# Patient Record
Sex: Male | Born: 1995
Health system: Southern US, Community
[De-identification: ages and names within clinical notes are randomized; demographics above are authoritative.]

## PROBLEM LIST (undated history)

## (undated) DIAGNOSIS — F909 Attention-deficit hyperactivity disorder, unspecified type: Secondary | ICD-10-CM

## (undated) DIAGNOSIS — J45909 Unspecified asthma, uncomplicated: Secondary | ICD-10-CM

## (undated) HISTORY — DX: Attention-deficit hyperactivity disorder, unspecified type: F90.9

## (undated) HISTORY — PX: OTHER SURGICAL HISTORY: SHX169

## (undated) HISTORY — DX: Unspecified asthma, uncomplicated: J45.909

## (undated) HISTORY — PX: WISDOM TOOTH EXTRACTION: SHX21

---
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
## 2015-10-27 ENCOUNTER — Encounter: Payer: Self-pay | Admitting: *Deleted

## 2015-11-03 ENCOUNTER — Ambulatory Visit (INDEPENDENT_AMBULATORY_CARE_PROVIDER_SITE_OTHER): Payer: Managed Care, Other (non HMO) | Admitting: General Surgery

## 2015-11-03 ENCOUNTER — Encounter: Payer: Self-pay | Admitting: General Surgery

## 2015-11-03 VITALS — BP 128/68 | HR 68 | Resp 12 | Ht 69.0 in | Wt 180.0 lb

## 2015-11-03 DIAGNOSIS — L0501 Pilonidal cyst with abscess: Secondary | ICD-10-CM | POA: Diagnosis not present

## 2015-11-03 MED ORDER — SULFAMETHOXAZOLE-TRIMETHOPRIM 800-160 MG PO TABS: 1.0000 | ORAL_TABLET | Freq: Two times a day (BID) | ORAL | Status: DC

## 2015-11-03 NOTE — Progress Notes (Signed)
Patient ID: Nicholas Villanueva, male   DOB: Mar 12, 1996, 20 y.o.   MRN: [id number]  Chief Complaint  Patient presents with  . Other    pilonidal cyst    HPI Nicholas Villanueva is a 20 y.o. male here for evaluation of a possible pilonidal cyst. He is currently on antibiotic therapy for this. He states it started about one month ago with drainage and tenderness. Denies an injury or trauma. 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 He is here today with his mother, Nicholas Villanueva. He completed his first 2 years of college at [employer]. He did score a 5 on the AP calculus exam. I personally reviewed the patient's history.  HPI  Past Medical History  Diagnosis Date  . Asthma   . ADHD (attention deficit hyperactivity disorder)     Past Surgical History  Procedure Laterality Date  . Skin biopsies  8 years ago    neck    Family History  Problem Relation Age of Onset  . Breast cancer Mother     Social History Social History  Substance Use Topics  . Smoking status: Never Smoker   . Smokeless tobacco: Never Used  . Alcohol Use: No    No Known Allergies  Current Outpatient Prescriptions  Medication Sig Dispense Refill  . doxycycline (VIBRAMYCIN) 100 MG capsule Take 1 capsule by mouth 2 (two) times daily.  0  . methylphenidate 27 MG PO CR tablet Take 27 mg by mouth every morning.  0  . sulfamethoxazole-trimethoprim (BACTRIM DS) 800-160 MG tablet Take 1 tablet by mouth 2 (two) times daily. 20 tablet 0   No current facility-administered medications for this visit.    Review of Systems Review of Systems  Constitutional: Negative.   Respiratory: Negative.   Cardiovascular: Negative.     Blood pressure 128/68, pulse 68, resp. rate 12, height 5\' 9"  (1.753 m), weight 180 lb (81.647 kg).  Physical Exam Physical Exam  Constitutional: He is oriented to person, place, and time. He appears well-developed and well-nourished.  HENT:  Mouth/Throat:  Oropharynx is clear and moist.  Eyes: Conjunctivae are normal. No scleral icterus.  Neck: Neck supple.  Cardiovascular: Normal rate, regular rhythm and normal heart sounds.   Pulmonary/Chest: Effort normal and breath sounds normal.  Musculoskeletal:       Back:  Lymphadenopathy:    He has no cervical adenopathy.  Neurological: He is alert and oriented to person, place, and time.  Skin: Skin is warm and dry.  Pilonidal cyst  Psychiatric: His behavior is normal.    Data Reviewed [facility] notes of 10/27/2015 and 10/10/2015 reviewed. Biopsy result of this area by description benign epidermal hyperplasia with granulation tissue. The patient was placed on doxycycline.    Assessment    Pilonidal cyst with abscess.    Plan    This young man [place]. He is presently minimally symptomatic and would like to defer intervention until afterwards. He'll complete the presently prescribed doxycycline. Avoidance of sun exposure was reviewed. A prescription for Bactrim DS has been sent to his pharmacy to have on hand if he experiences a flare erring his travels. We'll look to planned for excision of this area in early July.  This should allow time for adequate healing prior to starting school at [employer] in mid-August.     Schedule surgery  Bactrim DS #20 to have on hand  while on vacation.  Patient's surgery has been scheduled for 12-12-15 at [facility].    PCP: None Ref: Dr Pilar Plate   Earline Mayotte 11/03/2015, 6:07 PM

## 2015-11-03 NOTE — Patient Instructions (Addendum)
Pilonidal Cyst A pilonidal cyst is a fluid-filled sac. It forms beneath the skin near your tailbone, at the top of the crease of your buttocks. A pilonidal cyst that is not large or infected may not cause symptoms or problems. If the cyst becomes irritated or infected, it may fill with pus. This causes pain and swelling (pilonidal abscess). An infected cyst may need to be treated with medicine, drained, or removed. CAUSES The cause of a pilonidal cyst is not known. One cause may be a hair that grows into your skin (ingrown hair). RISK FACTORS Pilonidal cysts are more common in boys and men. Risk factors include:  Having lots of hair near the crease of the buttocks.  Being overweight.  Having a pilonidal dimple.  Wearing tight clothing.  Not bathing or showering frequently.  Sitting for long periods of time. SIGNS AND SYMPTOMS Signs and symptoms of a pilonidal cyst may include:  Redness.  Pain and tenderness.  Warmth.  Swelling.  Pus.  Fever. DIAGNOSIS Your health care provider may diagnose a pilonidal cyst based on your symptoms and a physical exam. The health care provider may do a blood test to check for infection. If your cyst is draining pus, your health care provider may take a sample of the drainage to be tested at a laboratory. TREATMENT Surgery is the usual treatment for an infected pilonidal cyst. You may also have to take medicines before surgery. The type of surgery you have depends on the size and severity of the infected cyst. The different kinds of surgery include:  Incision and drainage. This is a procedure to open and drain the cyst.  Marsupialization. In this procedure, a large cyst or abscess may be opened and kept open by stitching the edges of the skin to the cyst walls.  Cyst removal. This procedure involves opening the skin and removing all or part of the cyst. HOME CARE INSTRUCTIONS  Follow all of your surgeon's instructions carefully if you had  surgery.  Take medicines only as directed by your health care provider.  If you were prescribed an antibiotic medicine, finish it all even if you start to feel better.  Keep the area around your pilonidal cyst clean and dry.  Clean the area as directed by your health care provider. Pat the area dry with a clean towel. Do not rub it as this may cause bleeding.  Remove hair from the area around the cyst as directed by your health care provider.  Do not wear tight clothing or sit in one place for long periods of time.  There are many different ways to close and cover an incision, including stitches, skin glue, and adhesive strips. Follow your health care provider's instructions on:  Incision care.  Bandage (dressing) changes and removal.  Incision closure removal. SEEK MEDICAL CARE IF:   You have drainage, redness, swelling, or pain at the site of the cyst.  You have a fever.   This information is not intended to replace advice given to you by your health care provider. Make sure you discuss any questions you have with your health care provider.   Document Released: 05/18/2000 Document Revised: 06/11/2014 Document Reviewed: 10/08/2013 Elsevier Interactive Patient Education 2016 [employer]  Patient[facility].

## 2015-11-14 ENCOUNTER — Encounter: Payer: Self-pay | Admitting: General Surgery

## 2015-11-14 ENCOUNTER — Ambulatory Visit (INDEPENDENT_AMBULATORY_CARE_PROVIDER_SITE_OTHER): Payer: Managed Care, Other (non HMO) | Admitting: General Surgery

## 2015-11-14 ENCOUNTER — Other Ambulatory Visit: Payer: Self-pay | Admitting: General Surgery

## 2015-11-14 VITALS — BP 126/78 | HR 68 | Ht 69.0 in | Wt 180.0 lb

## 2015-11-14 DIAGNOSIS — L0501 Pilonidal cyst with abscess: Secondary | ICD-10-CM

## 2015-11-14 NOTE — Progress Notes (Signed)
Patient ID: Nicholas Villanueva, male   DOB: [birth year], 20 y.o.   MRN: [id number]  Chief Complaint  Patient presents with  . Other    pilonidal cyst    HPI Nicholas HumblesCameron G Villanueva is a 20 y.o. male here for reassessment of pilonidal cyst. He is scheduled for excision on 12/12/15. He reports that the area has been draining Since his original evaluation, and after completing the doxycycline previously prescribed he began the Bactrim provided if he had a flare prior to his scheduled surgery. He reports the area has enlarged and he started his prescription for Bactrim on 11/11/15.  I personally reviewed the patient's history.  The patient is accompanied today by his mother, Rivka BarbaraGlenda. HPI  Past Medical History  Diagnosis Date  . Asthma   . ADHD (attention deficit hyperactivity disorder)     Past Surgical History  Procedure Laterality Date  . Skin biopsies  8 years ago    neck    Family History  Problem Relation Age of Onset  . Breast cancer Mother     Social History Social History  Substance Use Topics  . Smoking status: Never Smoker   . Smokeless tobacco: Never Used  . Alcohol Use: No    Allergies  Allergen Reactions  . Other Anaphylaxis    Peanuts    Current Outpatient Prescriptions  Medication Sig Dispense Refill  . doxycycline (VIBRAMYCIN) 100 MG capsule Take 1 capsule by mouth 2 (two) times daily.  0  . methylphenidate 27 MG PO CR tablet Take 27 mg by mouth every morning.  0  . sulfamethoxazole-trimethoprim (BACTRIM DS) 800-160 MG tablet Take 1 tablet by mouth 2 (two) times daily. 20 tablet 0   No current facility-administered medications for this visit.    Review of Systems Review of Systems  Constitutional: Negative.   Respiratory: Negative.   Cardiovascular: Negative.     Blood pressure 126/78, pulse 68, height 5\' 9"  (1.753 m), weight 180 lb (81.647 kg).  Physical Exam Physical Exam  Musculoskeletal:       Back:      Assessment    Chronic abscess related to  pilonidal cyst.    Plan    It was elected to completed an incision and drainage of the draining site. This was completed using 10 mL of 0.5% Xylocaine with 0.25% Marcaine with 1-200,000 epinephrine after cleansing the skin with Betadine. The nodular area was admitted through a short elliptical incision. Thick mucoid material consistent with pilonidal cyst fluid and a few hairs were identified. This tracked towards the central "pits". Dry dressing applied. Wound care reviewed with the mother.  The patient will take his doses of Bactrim today, but discontinue after these doses.  We'll plan for reexamination in one week and his formal surgical excision as originally scheduled.    PCP Minter This has been scribed by Sinda Duaryl-Lyn M Kennedy LPN    Earline MayotteByrnett, Samanvi Cuccia W 11/14/2015, 8:43 PM

## 2015-11-14 NOTE — Patient Instructions (Signed)
You may shower. You will have some drainage this is normal. Take one dose of Bactrim  tonight, then stop. Return in 1 week.

## 2015-11-22 ENCOUNTER — Encounter: Payer: Self-pay | Admitting: General Surgery

## 2015-11-22 ENCOUNTER — Ambulatory Visit (INDEPENDENT_AMBULATORY_CARE_PROVIDER_SITE_OTHER): Payer: Managed Care, Other (non HMO) | Admitting: General Surgery

## 2015-11-22 VITALS — BP 124/80 | HR 78 | Resp 12 | Ht 69.0 in | Wt 180.0 lb

## 2015-11-22 DIAGNOSIS — L0501 Pilonidal cyst with abscess: Secondary | ICD-10-CM

## 2015-11-22 NOTE — Patient Instructions (Signed)
Patient is scheduled for his excision on 12/12/15.

## 2015-11-22 NOTE — Progress Notes (Signed)
Patient ID: Nicholas Villanueva, male   DOB: 1996/03/25, 20 y.o.   MRN: [id number]  Chief Complaint  Patient presents with  . Follow-up    pilonidal cyst     HPI Nicholas Villanueva is a 20 y.o. male here today for his one week follow up pilonidal cyst. He is scheduled for excision on 12/12/15. He reports that the area is not draining anymore.    He denies any pain.  I reviewed the history. HPI  Past Medical History  Diagnosis Date  . Asthma   . ADHD (attention deficit hyperactivity disorder)     Past Surgical History  Procedure Laterality Date  . Skin biopsies  8 years ago    neck    Family History  Problem Relation Age of Onset  . Breast cancer Mother     Social History Social History  Substance Use Topics  . Smoking status: Never Smoker   . Smokeless tobacco: Never Used  . Alcohol Use: No    Allergies  Allergen Reactions  . Other Anaphylaxis    Peanuts    Current Outpatient Prescriptions  Medication Sig Dispense Refill  . methylphenidate 27 MG PO CR tablet Take 27 mg by mouth every morning.  0   No current facility-administered medications for this visit.    Review of Systems Review of Systems  Constitutional: Negative.   Respiratory: Negative.   Cardiovascular: Negative.     Blood pressure 124/80, pulse 78, resp. rate 12, height 5\' 9"  (1.753 m), weight 180 lb (81.647 kg).  Physical Exam Physical Exam  Constitutional: He is oriented to person, place, and time. He appears well-developed and well-nourished.  Musculoskeletal:       Back:  Neurological: He is alert and oriented to person, place, and time.  Skin: Skin is warm.    Data Reviewed Pathology showed chronic inflammatory tissue consistent with a pilonidal cyst.  Assessment    Doing well status post excision of main abscess cavity.    Plan    [place] week of July and we'll plan for formal excision of the pilonidal cyst on his  return.   Return for excision that is scheduled for 12/12/15. PCP:  Chelsea PrimusMinter This information has been scribed by Ples SpecterJessica Qualls CMA.    Earline MayotteByrnett, Wasif Simonich W 11/22/2015, 2:49 PM

## 2015-12-05 ENCOUNTER — Inpatient Hospital Stay: Admission: RE | Admit: 2015-12-05 | Payer: Self-pay | Source: Ambulatory Visit

## 2015-12-05 ENCOUNTER — Encounter: Payer: Self-pay | Admitting: *Deleted

## 2015-12-05 NOTE — Patient Instructions (Signed)
  Your procedure is scheduled on: 12/12/15 Report to Day Surgery.[facility]OND FLOOR To find out your arrival time please call [phone] between 1PM - 3PM on 12/09/15  Remember: Instructions that are not followed completely may result in serious medical risk, up to and including death, or upon the discretion of your surgeon and anesthesiologist your surgery may need to be rescheduled.    _X___ 1. Do not eat food or drink liquids after midnight. No gum chewing or hard candies.     __X__ 2. No Alcohol for 24 hours before or after surgery.   __X__ 3. Do Not Smoke For 24 Hours Prior to Your Surgery.   ____ 4. Bring all medications with you on the day of surgery if instructed.    _X___ 5. Notify your doctor if there is any change in your medical condition     (cold, fever, infections).       Do not wear jewelry, make-up, hairpins, clips or nail polish.  Do not wear lotions, powders, or perfumes. You may wear deodorant.  Do not shave 48 hours prior to surgery. Men may shave face and neck.  Do not bring valuables to the hospital.    [facility] is not responsible for any belongings or valuables.               Contacts, dentures or bridgework may not be worn into surgery.  Leave your suitcase in the car. After surgery it may be brought to your room.  For patients admitted to the hospital, discharge time is determined by your                treatment team.   Patients discharged the day of surgery will not be allowed to drive home.   Please read over the following fact sheets that you were given:   Surgical Site Infection Prevention   ____ Take these medicines the morning of surgery with A SIP OF WATER:    1. NONE  2.   3.   4.  5.  6.  ____ Fleet Enema (as directed)   ____ Use CHG Soap as directed  ____ Use inhalers on the day of surgery  ____ Stop metformin 2 days prior to surgery    ____ Take 1/2 of usual insulin dose the night before surgery and none on the morning  of surgery.   ____ Stop Coumadin/Plavix/aspirin on   ____ Stop Anti-inflammatories on   ____ Stop supplements until after surgery.    ____ Bring C-Pap to the hospital.  

## 2015-12-05 NOTE — Pre-Procedure Instructions (Signed)
OUT OF TOWN UNTIL 12/11/15. UNABLE TO PICK UP CHG SOAP

## 2015-12-12 ENCOUNTER — Ambulatory Visit
Admission: RE | Admit: 2015-12-12 | Discharge: 2015-12-12 | Disposition: A | Discharge status: Home / Self Care | Payer: Managed Care, Other (non HMO) | Source: Ambulatory Visit | Attending: General Surgery | Admitting: General Surgery

## 2015-12-12 ENCOUNTER — Ambulatory Visit: Payer: Managed Care, Other (non HMO) | Admitting: Anesthesiology

## 2015-12-12 ENCOUNTER — Encounter
Admission: RE | Disposition: A | Discharge status: Home / Self Care | Payer: Self-pay | Source: Ambulatory Visit | Attending: General Surgery

## 2015-12-12 ENCOUNTER — Encounter: Payer: Self-pay | Admitting: *Deleted

## 2015-12-12 DIAGNOSIS — L0501 Pilonidal cyst with abscess: Secondary | ICD-10-CM | POA: Diagnosis not present

## 2015-12-12 HISTORY — PX: PILONIDAL CYST EXCISION: SHX744

## 2015-12-12 SURGERY — EXCISION, PILONIDAL CYST, EXTENSIVE
Anesthesia: General | Wound class: Dirty or Infected

## 2015-12-12 MED ORDER — SUCCINYLCHOLINE CHLORIDE 20 MG/ML IJ SOLN
INTRAMUSCULAR | Status: DC | PRN
  Administered 2015-12-12: 100 mg via INTRAVENOUS

## 2015-12-12 MED ORDER — BUPIVACAINE-EPINEPHRINE (PF) 0.5% -1:200000 IJ SOLN
INTRAMUSCULAR | Status: AC
  Filled 2015-12-12: qty 30

## 2015-12-12 MED ORDER — FENTANYL CITRATE (PF) 100 MCG/2ML IJ SOLN
25.0000 ug | INTRAMUSCULAR | Status: DC | PRN
  Administered 2015-12-12: 25 ug via INTRAVENOUS

## 2015-12-12 MED ORDER — ONDANSETRON HCL 4 MG/2ML IJ SOLN
4.0000 mg | Freq: Once | INTRAMUSCULAR | Status: AC | PRN
  Administered 2015-12-12: 4 mg via INTRAVENOUS

## 2015-12-12 MED ORDER — SODIUM CHLORIDE 0.9 % IV SOLN
1.0000 g | INTRAVENOUS | Status: DC
  Filled 2015-12-12: qty 1

## 2015-12-12 MED ORDER — GLYCOPYRROLATE 0.2 MG/ML IJ SOLN
INTRAMUSCULAR | Status: DC | PRN
  Administered 2015-12-12: 0.6 mg via INTRAVENOUS

## 2015-12-12 MED ORDER — SODIUM CHLORIDE 0.9 % IV SOLN
1.0000 g | INTRAVENOUS | Status: AC
  Administered 2015-12-12: 1 g via INTRAVENOUS
  Filled 2015-12-12: qty 1

## 2015-12-12 MED ORDER — NEOSTIGMINE METHYLSULFATE 10 MG/10ML IV SOLN
INTRAVENOUS | Status: DC | PRN
  Administered 2015-12-12: 3 mg via INTRAVENOUS

## 2015-12-12 MED ORDER — MIDAZOLAM HCL 2 MG/2ML IJ SOLN
INTRAMUSCULAR | Status: DC | PRN
  Administered 2015-12-12: 2 mg via INTRAVENOUS

## 2015-12-12 MED ORDER — BUPIVACAINE HCL (PF) 0.5 % IJ SOLN
INTRAMUSCULAR | Status: AC
  Filled 2015-12-12: qty 30

## 2015-12-12 MED ORDER — PROPOFOL 10 MG/ML IV BOLUS
INTRAVENOUS | Status: DC | PRN
  Administered 2015-12-12: 160 mg via INTRAVENOUS

## 2015-12-12 MED ORDER — LIDOCAINE-EPINEPHRINE 1 %-1:100000 IJ SOLN
INTRAMUSCULAR | Status: AC
  Filled 2015-12-12: qty 1

## 2015-12-12 MED ORDER — SODIUM BICARBONATE 4 % IV SOLN
INTRAVENOUS | Status: AC
  Filled 2015-12-12: qty 5

## 2015-12-12 MED ORDER — BUPIVACAINE-EPINEPHRINE (PF) 0.5% -1:200000 IJ SOLN
INTRAMUSCULAR | Status: DC | PRN
  Administered 2015-12-12: 30 mL via PERINEURAL

## 2015-12-12 MED ORDER — HYDROCODONE-ACETAMINOPHEN 5-325 MG PO TABS: 1.0000 | ORAL_TABLET | ORAL | Status: DC | PRN

## 2015-12-12 MED ORDER — ACETAMINOPHEN 10 MG/ML IV SOLN
INTRAVENOUS | Status: AC
  Filled 2015-12-12: qty 100

## 2015-12-12 MED ORDER — FAMOTIDINE 20 MG PO TABS
20.0000 mg | ORAL_TABLET | Freq: Once | ORAL | Status: AC
  Administered 2015-12-12: 20 mg via ORAL

## 2015-12-12 MED ORDER — LACTATED RINGERS IV SOLN
INTRAVENOUS | Status: DC
  Administered 2015-12-12: 07:00:00 via INTRAVENOUS

## 2015-12-12 MED ORDER — FENTANYL CITRATE (PF) 100 MCG/2ML IJ SOLN
INTRAMUSCULAR | Status: DC | PRN
  Administered 2015-12-12 (×2): 50 ug via INTRAVENOUS
  Administered 2015-12-12: 25 ug via INTRAVENOUS

## 2015-12-12 MED ORDER — KETOROLAC TROMETHAMINE 30 MG/ML IJ SOLN
INTRAMUSCULAR | Status: DC | PRN
  Administered 2015-12-12: 30 mg via INTRAVENOUS

## 2015-12-12 MED ORDER — ROCURONIUM BROMIDE 100 MG/10ML IV SOLN
INTRAVENOUS | Status: DC | PRN
  Administered 2015-12-12 (×2): 10 mg via INTRAVENOUS
  Administered 2015-12-12: 5 mg via INTRAVENOUS

## 2015-12-12 MED ORDER — LIDOCAINE HCL (CARDIAC) 20 MG/ML IV SOLN
INTRAVENOUS | Status: DC | PRN
  Administered 2015-12-12: 30 mg via INTRAVENOUS

## 2015-12-12 MED ORDER — FAMOTIDINE 20 MG PO TABS
ORAL_TABLET | ORAL | Status: AC
  Filled 2015-12-12: qty 1

## 2015-12-12 MED ORDER — ONDANSETRON HCL 4 MG/2ML IJ SOLN
INTRAMUSCULAR | Status: AC
  Filled 2015-12-12: qty 2

## 2015-12-12 MED ORDER — FENTANYL CITRATE (PF) 100 MCG/2ML IJ SOLN
INTRAMUSCULAR | Status: AC
  Filled 2015-12-12: qty 2

## 2015-12-12 MED ORDER — ONDANSETRON HCL 4 MG/2ML IJ SOLN
INTRAMUSCULAR | Status: DC | PRN
  Administered 2015-12-12: 4 mg via INTRAVENOUS

## 2015-12-12 MED ORDER — ACETAMINOPHEN 10 MG/ML IV SOLN
INTRAVENOUS | Status: DC | PRN
  Administered 2015-12-12: 1000 mg via INTRAVENOUS

## 2015-12-12 SURGICAL SUPPLY — 38 items
BLADE CLIPPER SURG (BLADE) ×3 IMPLANT
BLADE SURG 11 STRL SS SAFETY (MISCELLANEOUS) ×3 IMPLANT
BLADE SURG 15 STRL SS SAFETY (BLADE) ×3 IMPLANT
BULB RESERV EVAC DRAIN JP 100C (MISCELLANEOUS) ×3 IMPLANT
CANISTER SUCT 1200ML W/VALVE (MISCELLANEOUS) ×3 IMPLANT
CLOSURE WOUND 1/2 X4 (GAUZE/BANDAGES/DRESSINGS) ×1
DRAIN CHANNEL JP 15F RND 16 (MISCELLANEOUS) ×3 IMPLANT
DRAPE LAPAROTOMY 100X77 ABD (DRAPES) ×3 IMPLANT
DRSG TEGADERM 4X4.75 (GAUZE/BANDAGES/DRESSINGS) ×6 IMPLANT
DRSG TELFA 3X8 NADH (GAUZE/BANDAGES/DRESSINGS) ×6 IMPLANT
ELECT CAUTERY BLADE 6.4 (BLADE) ×3 IMPLANT
ELECT REM PT RETURN 9FT ADLT (ELECTROSURGICAL) ×3
ELECTRODE REM PT RTRN 9FT ADLT (ELECTROSURGICAL) ×1 IMPLANT
GLOVE BIO SURGEON STRL SZ 6.5 (GLOVE) ×2 IMPLANT
GLOVE BIO SURGEON STRL SZ7.5 (GLOVE) ×3 IMPLANT
GLOVE BIO SURGEONS STRL SZ 6.5 (GLOVE) ×1
GLOVE INDICATOR 8.0 STRL GRN (GLOVE) ×3 IMPLANT
GOWN STRL REUS W/ TWL LRG LVL3 (GOWN DISPOSABLE) ×2 IMPLANT
GOWN STRL REUS W/TWL LRG LVL3 (GOWN DISPOSABLE) ×4
KIT RM TURNOVER STRD PROC AR (KITS) ×3 IMPLANT
LABEL OR SOLS (LABEL) IMPLANT
NDL SAFETY 22GX1.5 (NEEDLE) ×3 IMPLANT
NEEDLE HYPO 25X1 1.5 SAFETY (NEEDLE) ×3 IMPLANT
NS IRRIG 500ML POUR BTL (IV SOLUTION) ×3 IMPLANT
PACK BASIN MINOR ARMC (MISCELLANEOUS) ×3 IMPLANT
SOL PREP PVP 2OZ (MISCELLANEOUS) ×3
SOLUTION PREP PVP 2OZ (MISCELLANEOUS) ×1 IMPLANT
STRIP CLOSURE SKIN 1/2X4 (GAUZE/BANDAGES/DRESSINGS) ×2 IMPLANT
SUT ETHILON 3-0 FS-10 30 BLK (SUTURE) ×3
SUT PROLENE 4-0 (SUTURE) ×2
SUT PROLENE 4-0 RB1 30XMFL BLU (SUTURE) ×1
SUT VIC AB 2-0 CT2 27 (SUTURE) ×6 IMPLANT
SUT VICRYL 3-0 27IN (SUTURE) IMPLANT
SUT VICRYL+ 3-0 144IN (SUTURE) ×3 IMPLANT
SUTURE EHLN 3-0 FS-10 30 BLK (SUTURE) ×1 IMPLANT
SUTURE PROLEN 4-0 RB1 30XMFL (SUTURE) ×1 IMPLANT
SWABSTK COMLB BENZOIN TINCTURE (MISCELLANEOUS) ×9 IMPLANT
SYR CONTROL 10ML (SYRINGE) ×3 IMPLANT

## 2015-12-12 NOTE — Anesthesia Preprocedure Evaluation (Signed)
Anesthesia Evaluation  Patient identified by MRN, date of birth, ID band Patient awake    Reviewed: Allergy & Precautions, NPO status , Patient's Chart, lab work & pertinent test results, reviewed documented beta blocker date and time   Airway Mallampati: II  TM Distance: >3 FB     Dental  (+) Chipped   Pulmonary           Cardiovascular      Neuro/Psych    GI/Hepatic   Endo/Other    Renal/GU      Musculoskeletal   Abdominal   Peds  Hematology   Anesthesia Other Findings   Reproductive/Obstetrics                             Anesthesia Physical Anesthesia Plan  ASA: II  Anesthesia Plan: General   Post-op Pain Management:    Induction: Intravenous  Airway Management Planned: LMA and Oral ETT  Additional Equipment:   Intra-op Plan:   Post-operative Plan:   Informed Consent: I have reviewed the patients History and Physical, chart, labs and discussed the procedure including the risks, benefits and alternatives for the proposed anesthesia with the patient or authorized representative who has indicated his/her understanding and acceptance.     Plan Discussed with: CRNA  Anesthesia Plan Comments:         Anesthesia Quick Evaluation

## 2015-12-12 NOTE — Transfer of Care (Signed)
Immediate Anesthesia Transfer of Care Note  Patient: Nicholas HumblesCameron G Hechavarria  Procedure(s) Performed: Procedure(s): CYST EXCISION PILONIDAL EXTENSIVE (N/A)  Patient Location: PACU  Anesthesia Type:General  Level of Consciousness: awake and sedated  Airway & Oxygen Therapy: Patient Spontanous Breathing and Patient connected to face mask oxygen  Post-op Assessment: Report given to RN and Post -op Vital signs reviewed and stable  Post vital signs: Reviewed and stable  Last Vitals:  Filed Vitals:   12/12/15 0601  BP: 123/71  Pulse: 70  Temp: 36.6 C  Resp: 16    Last Pain: There were no vitals filed for this visit.       Complications: No apparent anesthesia complications

## 2015-12-12 NOTE — Discharge Instructions (Signed)

## 2015-12-12 NOTE — Progress Notes (Signed)
Applied ice pack to bottom

## 2015-12-12 NOTE — Anesthesia Postprocedure Evaluation (Signed)
Anesthesia Post Note  Patient: Richrd HumblesCameron G Knisley  Procedure(s) Performed: Procedure(s) (LRB): CYST EXCISION PILONIDAL EXTENSIVE (N/A)  Patient location during evaluation: Other Anesthesia Type: General Level of consciousness: awake and alert Pain management: pain level controlled Vital Signs Assessment: post-procedure vital signs reviewed and stable Respiratory status: spontaneous breathing, nonlabored ventilation, respiratory function stable and patient connected to nasal cannula oxygen Cardiovascular status: blood pressure returned to baseline and stable Postop Assessment: no signs of nausea or vomiting Anesthetic complications: no    Last Vitals:  Filed Vitals:   12/12/15 0930 12/12/15 1040  BP: 126/68 108/68  Pulse: 65 66  Temp: 36.3 C 36.3 C  Resp: 14 14    Last Pain:  Filed Vitals:   12/12/15 1102  PainSc: 0-No pain                 Nahshon Reich S

## 2015-12-12 NOTE — Anesthesia Procedure Notes (Signed)
Procedure Name: Intubation Performed by: Tonia GhentOOK-MARTIN, Semaj Kham Pre-anesthesia Checklist: Patient identified, Emergency Drugs available, Suction available, Patient being monitored and Timeout performed Patient Re-evaluated:Patient Re-evaluated prior to inductionOxygen Delivery Method: Circle system utilized and Simple face mask Preoxygenation: Pre-oxygenation with 100% oxygen Intubation Type: IV induction Ventilation: Mask ventilation without difficulty Laryngoscope Size: 3 and Miller Grade View: Grade I Tube type: Oral Nasal Tubes: Right Tube size: 7.0 mm Number of attempts: 1 Airway Equipment and Method: Stylet Placement Confirmation: ETT inserted through vocal cords under direct vision,  positive ETCO2 and CO2 detector Secured at: 22 cm Tube secured with: Tape Dental Injury: Teeth and Oropharynx as per pre-operative assessment

## 2015-12-12 NOTE — H&P (Signed)
Recurrent drainage since last visit, started on po Bactrim. For pilonidal cyst excision.

## 2015-12-12 NOTE — Op Note (Signed)
Preoperative diagnosis: Pilonidal cyst with abscess.  Postoperative diagnosis: Same.  Operative procedure: Excision of pilonidal cyst and abscess with rotation flap coverage.  Operating surgeon: Donnalee CurryJeffrey Logun Colavito, M.D.  Anesthesia: Gen. endotracheal, Marcaine 0.5% with 1-200,000 of epinephrine, 30 mL.  Estimated blood loss: Less than 20 mL.  Clinical note: This 20 year old male has developed a pilonidal cyst with abscess. He has 6 "pits" along the midline and has developed a recurrent abscess superior and to the right of these structures. He was admitted for elective repair. He received Invanz prior to the procedure. SCD stockings for DVT prevention.  Operative note: The patient underwent general anesthesia was rolled to the prone position and properly padded. The buttocks were taped apart and hair removed with clippers. The area was prepped with Betadine solution and draped. Marcaine was infiltrated peripherally for postoperative analgesia and hemostasis. The area for excision was outlined and the skin incised with a knife and the remaining dissection with electrocautery. The dissection carried down to the sacral fascia. The adipose tissue was then elevated off the gluteus fascia for approximately 2 cm and a superficial flap perhaps 6 mm in diameter made on the right side of the buttock extending for approximately 5 cm. The adipose tissue was then approximated in the midline with interrupted 2-0 Vicryl figure-of-eight sutures. A 15-gauge Blake drain was placed and brought out through a separate stab wound incision to the right of the midline and anchored in place with 3-0 nylon. The superficial layer of the adipose tissue was approximated in a similar fashion. Redundant skin approximately a centimeter in diameter was excised from the right flap. Deep dermal layer was approximated with interrupted 2-0 Vicryl sutures and the skin closed with a running 3-0 Vicryl subcuticular suture. Benzoin, Steri-Strips,  Telfa and Tegaderm dressing applied.  The patient tolerated the procedure well and was taken to the recovery room in stable condition.

## 2015-12-13 LAB — SURGICAL PATHOLOGY

## 2015-12-15 ENCOUNTER — Encounter: Payer: Self-pay | Admitting: General Surgery

## 2015-12-15 ENCOUNTER — Ambulatory Visit (INDEPENDENT_AMBULATORY_CARE_PROVIDER_SITE_OTHER): Payer: Managed Care, Other (non HMO) | Admitting: General Surgery

## 2015-12-15 VITALS — BP 116/78 | HR 80 | Resp 12 | Ht 69.0 in | Wt 179.0 lb

## 2015-12-15 DIAGNOSIS — L0501 Pilonidal cyst with abscess: Secondary | ICD-10-CM

## 2015-12-15 NOTE — Patient Instructions (Signed)
Patient to return in two weeks  

## 2015-12-15 NOTE — Progress Notes (Signed)
Patient ID: Nicholas Villanueva, male   DOB: April 02, 1996, 20 y.o.   MRN: [id number]  Chief Complaint  Patient presents with  . Routine Post Op    pilonidal cyst    HPI Nicholas Villanueva is a 20 y.o. male here today for his 3 day follow up pilonidal cyst excision done on 12/12/15. Patient states he is draining and some bleeding not much pain.  Drain sheet present. Mom, Rivka BarbaraGlenda present.  I personally reviewed the patient's history. HPI  Past Medical History  Diagnosis Date  . Asthma   . ADHD (attention deficit hyperactivity disorder)     Past Surgical History  Procedure Laterality Date  . Skin biopsies  8 years ago    neck  . Wisdom tooth extraction    . Pilonidal cyst excision N/A 12/12/2015    Procedure: CYST EXCISION PILONIDAL EXTENSIVE;  Surgeon: Earline MayotteJeffrey W Cosima Prentiss, MD;  Location: [facility] ORS;  Service: General;  Laterality: N/A;    Family History  Problem Relation Age of Onset  . Breast cancer Mother     Social History Social History  Substance Use Topics  . Smoking status: Never Smoker   . Smokeless tobacco: Never Used  . Alcohol Use: No    Allergies  Allergen Reactions  . Other Anaphylaxis    Peanuts    Current Outpatient Prescriptions  Medication Sig Dispense Refill  . methylphenidate 27 MG PO CR tablet Take 27 mg by mouth every morning.  0   No current facility-administered medications for this visit.    Review of Systems Review of Systems  Constitutional: Negative.   Respiratory: Negative.   Cardiovascular: Negative.     Blood pressure 116/78, pulse 80, resp. rate 12, height 5\' 9"  (1.753 m), weight 179 lb (81.194 kg).  Physical Exam Physical Exam  Constitutional: He is oriented to person, place, and time. He appears well-developed and well-nourished.  Eyes: Conjunctivae are normal. No scleral icterus.  Neck: Neck supple.  Cardiovascular: Normal rate, regular rhythm and normal heart sounds.   Pulmonary/Chest: Effort normal and breath sounds normal.  Right  mastectomy site is clean and well healed.  Abdominal: Normal appearance. There is no hepatomegaly.  Musculoskeletal:       Back:  Lymphadenopathy:    He has no cervical adenopathy.  Neurological: He is oriented to person, place, and time.  Skin: Skin is warm and dry.    Data Reviewed December 12, 2015 pathology: DIAGNOSIS:  A. PILONIDAL CYST; EXCISION:  - RUPTURED PILONIDAL CYST.  - NEGATIVE FOR MALIGNANCY.   Assessment    Doing well post pilonidal cyst/abscess removal.     Plan    The patient has had no need for narcotic analgesics. He'll continue on his Aleve for the next couple of days and then switch to a when necessary dosing schedule.    Patient to return in two weeks.  PCP:  Chelsea PrimusMinter,  This information has been scribed by Ples SpecterJessica Qualls CMA.    Earline MayotteByrnett, Lenzie Sandler W 12/15/2015, 9:48 PM

## 2015-12-29 ENCOUNTER — Encounter: Payer: Self-pay | Admitting: General Surgery

## 2015-12-29 ENCOUNTER — Ambulatory Visit (INDEPENDENT_AMBULATORY_CARE_PROVIDER_SITE_OTHER): Payer: Managed Care, Other (non HMO) | Admitting: General Surgery

## 2015-12-29 VITALS — BP 118/72 | HR 82 | Resp 14 | Ht 69.0 in | Wt 179.0 lb

## 2015-12-29 DIAGNOSIS — L0501 Pilonidal cyst with abscess: Secondary | ICD-10-CM

## 2015-12-29 NOTE — Progress Notes (Signed)
Patient ID: Nicholas Villanueva, male   DOB: [birth year], 20 y.o.   MRN: [id number]  Chief Complaint  Patient presents with  . Routine Post Op    pilonidal cyst    HPI Nicholas Villanueva is a 20 y.o. male here today for his 2 week follow up from a pilonidal cyst excision done on 12/12/15. Patient states the area is doing well.  HPI  Past Medical History:  Diagnosis Date  . ADHD (attention deficit hyperactivity disorder)   . Asthma     Past Surgical History:  Procedure Laterality Date  . PILONIDAL CYST EXCISION N/A 12/12/2015   Procedure: CYST EXCISION PILONIDAL EXTENSIVE;  Surgeon: Earline Mayotte, MD;  Location: [facility] ORS;  Service: General;  Laterality: N/A;  . skin biopsies  8 years ago   neck  . WISDOM TOOTH EXTRACTION      Family History  Problem Relation Age of Onset  . Breast cancer Mother     Social History Social History  Substance Use Topics  . Smoking status: Never Smoker  . Smokeless tobacco: Never Used  . Alcohol use No    Allergies  Allergen Reactions  . Other Anaphylaxis    Peanuts    Current Outpatient Prescriptions  Medication Sig Dispense Refill  . methylphenidate 27 MG PO CR tablet Take 27 mg by mouth every morning.  0   No current facility-administered medications for this visit.     Review of Systems Review of Systems  Constitutional: Negative.   Respiratory: Negative.   Cardiovascular: Negative.     Blood pressure 118/72, pulse 82, resp. rate 14, height 5\' 9"  (1.753 m), weight 179 lb (81.2 kg).  Physical Exam Physical Exam  Musculoskeletal:       Back:      Assessment    Doing well status post rotation flap coverage for a large pilonidal cyst with abscess.    Plan    The patient will report if any new changes occur. Follow up otherwise will be on an as-needed basis.    This information has been scribed by Ples Specter CMA.   Patient to return as needed.  Earline Mayotte 12/29/2015, 8:00 PM

## 2017-08-01 ENCOUNTER — Encounter: Payer: Self-pay | Admitting: Family Medicine

## 2017-08-01 NOTE — Progress Notes (Signed)
Medical [facility]. Patient scheduled for new patient appt on 08/05/17.  Reocrds were reviewed and revealed the following important information:  - Diagnosed with ADHD as child.  Was doing well on Concerta 27mg  daily at last visit. Previously tried Vyvanse  History and problem list updated.  Important information scanned into chart.  Erasmo DownerBacigalupo, Kurtiss Wence M, MD, MPH [facility] 08/01/2017 3:18 PM

## 2017-08-05 ENCOUNTER — Encounter: Payer: Self-pay | Admitting: Family Medicine

## 2017-08-05 ENCOUNTER — Ambulatory Visit: Payer: Commercial Managed Care - PPO | Admitting: Family Medicine

## 2017-08-05 VITALS — BP 118/74 | HR 82 | Temp 98.6°F | Resp 16 | Ht 69.0 in | Wt 180.0 lb

## 2017-08-05 DIAGNOSIS — F909 Attention-deficit hyperactivity disorder, unspecified type: Secondary | ICD-10-CM | POA: Insufficient documentation

## 2017-08-05 DIAGNOSIS — Z0000 Encounter for general adult medical examination without abnormal findings: Secondary | ICD-10-CM

## 2017-08-05 DIAGNOSIS — J45990 Exercise induced bronchospasm: Secondary | ICD-10-CM | POA: Insufficient documentation

## 2017-08-05 DIAGNOSIS — Z23 Encounter for immunization: Secondary | ICD-10-CM | POA: Diagnosis not present

## 2017-08-05 NOTE — Assessment & Plan Note (Signed)
Currently well controlled Not taking any medications at this time If needed in the future, he was doing well on Concerta 27 mg daily

## 2017-08-05 NOTE — Patient Instructions (Signed)
Preventive Care 18-39 Years, Male Preventive care refers to lifestyle choices and visits with your health care provider that can promote health and wellness. What does preventive care include?  A yearly physical exam. This is also called an annual well check.  Dental exams once or twice a year.  Routine eye exams. Ask your health care provider how often you should have your eyes checked.  Personal lifestyle choices, including: ? Daily care of your teeth and gums. ? Regular physical activity. ? Eating a healthy diet. ? Avoiding tobacco and drug use. ? Limiting alcohol use. ? Practicing safe sex. What happens during an annual well check? The services and screenings done by your health care provider during your annual well check will depend on your age, overall health, lifestyle risk factors, and family history of disease. Counseling Your health care provider may ask you questions about your:  Alcohol use.  Tobacco use.  Drug use.  Emotional well-being.  Home and relationship well-being.  Sexual activity.  Eating habits.  Work and work Statistician.  Screening You may have the following tests or measurements:  Height, weight, and BMI.  Blood pressure.  Lipid and cholesterol levels. These may be checked every 5 years starting at age 34.  Diabetes screening. This is done by checking your blood sugar (glucose) after you have not eaten for a while (fasting).  Skin check.  Hepatitis C blood test.  Hepatitis B blood test.  Sexually transmitted disease (STD) testing.  Discuss your test results, treatment options, and if necessary, the need for more tests with your health care provider. Vaccines Your health care provider may recommend certain vaccines, such as:  Influenza vaccine. This is recommended every year.  Tetanus, diphtheria, and acellular pertussis (Tdap, Td) vaccine. You may need a Td booster every 10 years.  Varicella vaccine. You may need this if you  have not been vaccinated.  HPV vaccine. If you are 23 or younger, you may need three doses over 6 months.  Measles, mumps, and rubella (MMR) vaccine. You may need at least one dose of MMR.You may also need a second dose.  Pneumococcal 13-valent conjugate (PCV13) vaccine. You may need this if you have certain conditions and have not been vaccinated.  Pneumococcal polysaccharide (PPSV23) vaccine. You may need one or two doses if you smoke cigarettes or if you have certain conditions.  Meningococcal vaccine. One dose is recommended if you are age 65-21 years and a first-year college student living in a residence hall, or if you have one of several medical conditions. You may also need additional booster doses.  Hepatitis A vaccine. You may need this if you have certain conditions or if you travel or work in places where you may be exposed to hepatitis A.  Hepatitis B vaccine. You may need this if you have certain conditions or if you travel or work in places where you may be exposed to hepatitis B.  Haemophilus influenzae type b (Hib) vaccine. You may need this if you have certain risk factors.  Talk to your health care provider about which screenings and vaccines you need and how often you need them. This information is not intended to replace advice given to you by your health care provider. Make sure you discuss any questions you have with your health care provider. Document Released: 07/17/2001 Document Revised: 02/08/2016 Document Reviewed: 03/22/2015 Elsevier Interactive Patient Education  [employer].

## 2017-08-05 NOTE — Assessment & Plan Note (Signed)
Currently well controlled and not performing high level exercises that exacerbate this Has never needed a controller medication in the past Could consider prescribing albuterol to have as needed

## 2017-08-05 NOTE — Progress Notes (Signed)
Patient: Nicholas Villanueva, Male    DOB: 06-24-1995, 22 y.o.   MRN: [id number] Visit Date: 08/05/2017  Today's Provider: Lavon Paganini, MD   I, Martha Clan, CMA, am acting as scribe for Lavon Paganini, MD.  Chief Complaint  Patient presents with  . Establish Care   Subjective:    Establish Care Nicholas Villanueva is a 22 y.o. male who presents today for health maintenance and to establish care. He feels well. He reports exercising none. He reports he is sleeping well.  His PMH includes asthma, ADHD, peanut allergy.   ----------------------------------------------------------------- Exercise-indused asthma: Hasn't needed albuterol since graduating high school.  Has never taken a controller inhaler.  Has only needed albuterol in the past when playing sports or in gym class.  ADHD: Used to take concerta Has been off it for 6 months Doing well at home and work Since not being in school, has not felt like he needs it.   Review of Systems  Constitutional: Negative.   HENT: Negative.   Eyes: Negative.   Respiratory: Negative.   Cardiovascular: Negative.   Gastrointestinal: Negative.   Endocrine: Negative.   Genitourinary: Negative.   Musculoskeletal: Negative.   Skin: Negative.   Allergic/Immunologic: Negative.   Neurological: Negative.   Hematological: Negative.   Psychiatric/Behavioral: Negative.     Social History      He  reports that  has never smoked. he has never used smokeless tobacco. He reports that he does not drink alcohol or use drugs.       Social History   Socioeconomic History  . Marital status: Single    Spouse name: None  . Number of children: 0  . Years of education: None  . Highest education level: Some college, no degree  Social Needs  . Financial resource strain: Not very hard  . Food insecurity - worry: Never true  . Food insecurity - inability: Never true  . Transportation needs - medical: No  . Transportation needs -  non-medical: No  Occupational History  . Occupation: temp  Tobacco Use  . Smoking status: Never Smoker  . Smokeless tobacco: Never Used  Substance and Sexual Activity  . Alcohol use: No    Alcohol/week: 0.0 oz  . Drug use: No  . Sexual activity: None  Other Topics Concern  . None  Social History Narrative  . None    Past Medical History:  Diagnosis Date  . ADHD (attention deficit hyperactivity disorder)   . Asthma      Patient Active Problem List   Diagnosis Date Noted  . Pilonidal cyst with abscess 11/03/2015    Past Surgical History:  Procedure Laterality Date  . PILONIDAL CYST EXCISION N/A 12/12/2015   Procedure: CYST EXCISION PILONIDAL EXTENSIVE;  Surgeon: Robert Bellow, MD;  Location: [facility] ORS;  Service: General;  Laterality: N/A;  . skin biopsies  8 years ago   neck  . WISDOM TOOTH EXTRACTION      Family History        Family Status  Relation Name Status  . Mother  Alive  . Father  Alive  . Brother  Alive  . MGM  Alive  . MGF  Alive  . PGM  Alive  . PGF  Alive  . Neg Hx  (Not Specified)        His family history includes Arthritis in his mother; Breast cancer in his maternal grandmother and mother; Diabetes in his maternal grandfather and paternal  grandmother; Healthy in his brother and father; Hypertension in his maternal grandfather, paternal grandfather, and paternal grandmother; Rheum arthritis in his maternal grandmother; Skin cancer in his maternal grandfather and paternal grandfather. There is no history of Colon cancer or Prostate cancer.      Allergies  Allergen Reactions  . Other Anaphylaxis    Peanuts     Current Outpatient Medications:  .  methylphenidate 27 MG PO CR tablet, Take 27 mg by mouth every morning., Disp: , Rfl: 0   Patient Care Team: Virginia Crews, MD as PCP - General (Family Medicine) Dasher, Rayvon Char, MD (Dermatology) Bary Castilla Forest Gleason, MD (General Surgery)      Objective:   Vitals: BP 118/74 (BP  Location: Left Arm, Patient Position: Sitting, Cuff Size: Large)   Pulse 82   Temp 98.6 F (37 C) (Oral)   Resp 16   Ht '5\' 9"'  (1.753 m)   Wt 180 lb (81.6 kg)   SpO2 98%   BMI 26.58 kg/m    Vitals:   08/05/17 1008  BP: 118/74  Pulse: 82  Resp: 16  Temp: 98.6 F (37 C)  TempSrc: Oral  SpO2: 98%  Weight: 180 lb (81.6 kg)  Height: '5\' 9"'  (1.753 m)     Physical Exam  Constitutional: He is oriented to person, place, and time. He appears well-developed and well-nourished. No distress.  HENT:  Head: Normocephalic and atraumatic.  Right Ear: External ear normal.  Left Ear: External ear normal.  Nose: Nose normal.  Mouth/Throat: Oropharynx is clear and moist.  Eyes: Conjunctivae and EOM are normal. Pupils are equal, round, and reactive to light. No scleral icterus.  Neck: Neck supple. No thyromegaly present.  Cardiovascular: Normal rate, regular rhythm, normal heart sounds and intact distal pulses.  No murmur heard. Pulmonary/Chest: Breath sounds normal. No respiratory distress. He has no wheezes. He has no rales.  Abdominal: Soft. Bowel sounds are normal. He exhibits no distension. There is no tenderness. There is no rebound and no guarding.  Musculoskeletal: He exhibits no edema or deformity.  Lymphadenopathy:    He has no cervical adenopathy.  Neurological: He is alert and oriented to person, place, and time. No cranial nerve deficit.  Skin: Skin is warm and dry. No rash noted.  Psychiatric: He has a normal mood and affect. His behavior is normal.  Vitals reviewed.    Depression Screen No flowsheet data found.   Assessment & Plan:     Routine Health Maintenance and Physical Exam  Exercise Activities and Dietary recommendations Goals    None      Immunization History  Administered Date(s) Administered  . DTaP 05/03/1995, 07/05/1995, 09/06/1995, 08/14/1996, 01/13/2001  . HPV Quadrivalent 01/23/2011, 03/23/2011, 01/25/2012  . Hepatitis A 01/30/2013  .  Hepatitis B 10/05/94, 05/03/1995, 12/05/1995  . HiB (PRP-OMP) 05/03/1995, 07/05/1995, 09/06/1995, 08/14/1996  . IPV 05/03/1995, 07/05/1995, 09/06/1995, 01/13/2001  . MMR 03/06/1996, 01/13/2001  . Meningococcal Mcv4o 01/20/2013  . Tdap 11/22/2006  . Varicella 03/06/1996, 01/23/2011    Health Maintenance  Topic Date Due  . HIV Screening  02/19/2010  . TETANUS/TDAP  11/21/2016  . INFLUENZA VACCINE  01/02/2017     Discussed health benefits of physical activity, and encouraged him to engage in regular exercise appropriate for his age and condition.    -------------------------------------------------------------------- Problem List Items Addressed This Visit      Respiratory   Exercise-induced asthma    Currently well controlled and not performing high level exercises that exacerbate this Has  never needed a controller medication in the past Could consider prescribing albuterol to have as needed        Other   ADHD    Currently well controlled Not taking any medications at this time If needed in the future, he was doing well on Concerta 27 mg daily       Other Visit Diagnoses    Encounter for annual physical exam    -  Primary   Need for hepatitis A vaccination       Relevant Orders   Hepatitis A vaccine adult IM (Completed)   Need for Td vaccine       Relevant Orders   Td vaccine greater than or equal to 7yo preservative free IM (Completed)       Return in about 1 year (around 08/06/2018) for physical.   The entirety of the information documented in the History of Present Illness, Review of Systems and Physical Exam were personally obtained by me. Portions of this information were initially documented by Raquel Sarna Ratchford, CMA and reviewed by me for thoroughness and accuracy.    Virginia Crews, MD, MPH [facility] 08/05/2017 10:51 AM

## 2017-12-10 ENCOUNTER — Encounter: Payer: Self-pay | Admitting: Family Medicine

## 2017-12-10 ENCOUNTER — Ambulatory Visit: Payer: Commercial Managed Care - PPO | Admitting: Family Medicine

## 2017-12-10 VITALS — BP 118/74 | HR 78 | Temp 97.6°F | Resp 16 | Wt 180.0 lb

## 2017-12-10 DIAGNOSIS — L0291 Cutaneous abscess, unspecified: Secondary | ICD-10-CM | POA: Diagnosis not present

## 2017-12-10 MED ORDER — SULFAMETHOXAZOLE-TRIMETHOPRIM 800-160 MG PO TABS: 1.0000 | ORAL_TABLET | Freq: Two times a day (BID) | ORAL | 0 refills | Status: DC

## 2017-12-10 MED ORDER — EPINEPHRINE 0.3 MG/0.3ML IJ SOAJ: 0.3000 mg | Freq: Once | INTRAMUSCULAR | 2 refills | Status: AC

## 2017-12-10 NOTE — Patient Instructions (Signed)

## 2017-12-10 NOTE — Progress Notes (Signed)
Patient: Nicholas HumblesCameron G Nease Male    DOB: 18-Jan-1996   22 y.o.   MRN: [id number] Visit Date: 12/10/2017  Today's Provider: Shirlee LatchAngela Meadow Abramo, MD   I, Joslyn HyEmily Ratchford, CMA, am acting as scribe for Shirlee LatchAngela L[place], MD.  Chief Complaint  Patient presents with  . Mass   Subjective:    HPI   Pt reports he has a lump on his chest that appeared last week. The size waxes and wanes. Pt states there is pain with pressure on the area, but otherwise no associated pain. There is some redness. He denies itching, drainage. He does not remember any trauma.  Thinks this looks similar to previous pilonidal cyst/abscess. No fevers, night sweats, weight loss.  Allergies  Allergen Reactions  . Other Anaphylaxis    Peanuts    No current outpatient medications on file.  Review of Systems  Constitutional: Negative for activity change, appetite change, chills, diaphoresis, fatigue, fever and unexpected weight change.  Respiratory: Negative for cough and shortness of breath.   Cardiovascular: Negative for chest pain, palpitations and leg swelling.  Musculoskeletal: Negative.   Skin: Positive for color change. Negative for rash and wound.  Neurological: Negative.   Hematological: Negative.     Social History   Tobacco Use  . Smoking status: Never Smoker  . Smokeless tobacco: Never Used  Substance Use Topics  . Alcohol use: No    Alcohol/week: 0.0 oz   Objective:   BP 118/74 (BP Location: Left Arm, Patient Position: Sitting, Cuff Size: Normal)   Pulse 78   Temp 97.6 F (36.4 C) (Oral)   Resp 16   Wt 180 lb (81.6 kg)   SpO2 97%   BMI 26.58 kg/m  Vitals:   12/10/17 0834  BP: 118/74  Pulse: 78  Resp: 16  Temp: 97.6 F (36.4 C)  TempSrc: Oral  SpO2: 97%  Weight: 180 lb (81.6 kg)     Physical Exam  Constitutional: He is oriented to person, place, and time. He appears well-developed and well-nourished. No distress.  HENT:  Head: Normocephalic and atraumatic.  Mouth/Throat:  Oropharynx is clear and moist.  Eyes: Pupils are equal, round, and reactive to light. Conjunctivae are normal. No scleral icterus.  Neck: Neck supple. No thyromegaly present.  Cardiovascular: Normal rate, regular rhythm, normal heart sounds and intact distal pulses.  No murmur heard. Pulmonary/Chest: Effort normal and breath sounds normal. No respiratory distress. He has no wheezes. He has no rales.  Abdominal: Soft. He exhibits no distension. There is no tenderness.  Musculoskeletal: He exhibits no edema.  Lymphadenopathy:    He has no cervical adenopathy.  Neurological: He is alert and oriented to person, place, and time.  Skin: Skin is warm and dry. Capillary refill takes less than 2 seconds. There is erythema.  3cm x 2cm area of induration and redness on anterior chest. No fluctuance.  Psychiatric: He has a normal mood and affect. His behavior is normal.  Vitals reviewed.      Assessment & Plan:   1. Abscess - sizeable abscess on anterior chest without any fluctuance - would not benefit from I&D at this time - treat with 7 d course of Bactrim - return precautions discussed - will make f/u appt in 1 wk to ensure resolution    Meds ordered this encounter  Medications  . sulfamethoxazole-trimethoprim (BACTRIM DS,SEPTRA DS) 800-160 MG tablet    Sig: Take 1 tablet by mouth 2 (two) times daily for 7 days.  Dispense:  14 tablet    Refill:  0  . EPINEPHrine 0.3 mg/0.3 mL IJ SOAJ injection    Sig: Inject 0.3 mLs (0.3 mg total) into the muscle once for 1 dose.    Dispense:  1 Device    Refill:  2     Return in about 1 week (around 12/17/2017).   The entirety of the information documented in the History of Present Illness, Review of Systems and Physical Exam were personally obtained by me. Portions of this information were initially documented by Irving Burton Ratchford, CMA and reviewed by me for thoroughness and accuracy.    Erasmo Downer, MD, MPH [facility] 12/10/2017 8:55 AM

## 2017-12-17 ENCOUNTER — Encounter: Payer: Self-pay | Admitting: Family Medicine

## 2017-12-17 ENCOUNTER — Ambulatory Visit: Payer: Commercial Managed Care - PPO | Admitting: Family Medicine

## 2017-12-17 VITALS — BP 122/76 | HR 72 | Temp 97.9°F | Resp 16

## 2017-12-17 DIAGNOSIS — L0291 Cutaneous abscess, unspecified: Secondary | ICD-10-CM | POA: Diagnosis not present

## 2017-12-17 MED ORDER — DOXYCYCLINE HYCLATE 100 MG PO TABS: 100.0000 mg | ORAL_TABLET | Freq: Two times a day (BID) | ORAL | 0 refills | Status: DC

## 2017-12-17 NOTE — Patient Instructions (Signed)
Incision and Drainage Incision and drainage is a surgical procedure to open and drain a fluid-filled sac. The sac may be filled with pus, mucus, or blood. Examples of fluid-filled sacs that may need surgical drainage include cysts, skin infections (abscesses), and red lumps that develop from a ruptured cyst or a small abscess (boils). You may need this procedure if the affected area is large, painful, infected, or not healing well. Tell a health care provider about:  Any allergies you have.  All medicines you are taking, including vitamins, herbs, eye drops, creams, and over-the-counter medicines.  Any problems you or family members have had with anesthetic medicines.  Any blood disorders you have.  Any surgeries you have had.  Any medical conditions you have.  Whether you are pregnant or may be pregnant. What are the risks? Generally, this is a safe procedure. However, problems may occur, including:  Infection.  Bleeding.  Allergic reactions to medicines.  Scarring.  What happens before the procedure?  You may need an ultrasound or other imaging tests to see how large or deep the fluid-filled sac is.  You may have blood tests to check for infection.  You may get a tetanus shot.  You may be given antibiotic medicine to help prevent infection.  Follow instructions from your health care provider about eating or drinking restrictions.  Ask your health care provider about: ? Changing or stopping your regular medicines. This is especially important if you are taking diabetes medicines or blood thinners. ? Taking medicines such as aspirin and ibuprofen. These medicines can thin your blood. Do not take these medicines before your procedure if your health care provider instructs you not to.  Plan to have someone take you home after the procedure.  If you will be going home right after the procedure, plan to have someone stay with you for 24 hours. What happens during the  procedure?  To reduce your risk of infection: ? Your health care team will wash or sanitize their hands. ? Your skin will be washed with soap.  You will be given one or more of the following: ? A medicine to help you relax (sedative). ? A medicine to numb the area (local anesthetic). ? A medicine to make you fall asleep (general anesthetic).  An incision will be made in the top of the fluid-filled sac.  The contents of the sac may be squeezed out, or a syringe or tube (catheter)may be used to empty the sac.  The catheter may be left in place for several weeks to drain any fluid. Or, your health care provider may stitch open the edges of the incision to make a long-term opening for drainage (marsupialization).  The inside of the sac may be washed out (irrigated) with a sterile solution and packed with gauze before it is covered with a bandage (dressing). The procedure may vary among health care providers and hospitals. What happens after the procedure?  Your blood pressure, heart rate, breathing rate, and blood oxygen level will be monitored often until the medicines you were given have worn off.  Do not drive for 24 hours if you received a sedative. This information is not intended to replace advice given to you by your health care provider. Make sure you discuss any questions you have with your health care provider. Document Released: 11/14/2000 Document Revised: 10/27/2015 Document Reviewed: 03/11/2015 Elsevier Interactive Patient Education  2018 Elsevier Inc.  

## 2017-12-17 NOTE — Progress Notes (Signed)
Patient: Nicholas Villanueva Male    DOB: 1995/12/06   22 y.o.   MRN: [id number] Visit Date: 12/17/2017  Today's Provider: Shirlee LatchAngela Reneta Niehaus, MD   I, Joslyn HyEmily Ratchford, CMA, am acting as scribe for Shirlee LatchAngela Aliayah Tyer, MD.  Chief Complaint  Patient presents with  . Abscess   Subjective:    HPI     Follow up for Abscess  The patient was last seen for this 1 weeks ago. Changes made at last visit include treating with 7 day course of Bactrim.  He reports good compliance with treatment. He feels that condition is Improved. States it is not as painful, but is currently erythematous. Size has not changed much.  No drainage. He is not having side effects.   ------------------------------------------------------------------------------------    Allergies  Allergen Reactions  . Other Anaphylaxis    Peanuts     Current Outpatient Medications:  .  EPINEPHrine 0.3 mg/0.3 mL IJ SOAJ injection, INJECT 0.3 MLS (0.3 MG TOTAL) INTO THE MUSCLE ONCE FOR 1 DOSE., Disp: , Rfl: 2 .  doxycycline (VIBRA-TABS) 100 MG tablet, Take 1 tablet (100 mg total) by mouth 2 (two) times daily., Disp: 20 tablet, Rfl: 0  Review of Systems  Constitutional: Negative for activity change, appetite change, chills, diaphoresis, fatigue, fever and unexpected weight change.  Respiratory: Negative for shortness of breath.   Cardiovascular: Negative for chest pain, palpitations and leg swelling.  Skin: Positive for color change and wound.    Social History   Tobacco Use  . Smoking status: Never Smoker  . Smokeless tobacco: Never Used  Substance Use Topics  . Alcohol use: No    Alcohol/week: 0.0 oz   Objective:   BP 122/76 (BP Location: Left Arm, Patient Position: Sitting, Cuff Size: Normal)   Pulse 72   Temp 97.9 F (36.6 C) (Oral)   Resp 16   SpO2 99%  Vitals:   12/17/17 0841  BP: 122/76  Pulse: 72  Resp: 16  Temp: 97.9 F (36.6 C)  TempSrc: Oral  SpO2: 99%     Physical Exam    Constitutional: He is oriented to person, place, and time. He appears well-developed and well-nourished. No distress.  HENT:  Head: Normocephalic and atraumatic.  Cardiovascular: Normal rate and regular rhythm.  Pulmonary/Chest: Effort normal. No respiratory distress.  Musculoskeletal: He exhibits no edema.  Neurological: He is alert and oriented to person, place, and time.  Skin: Skin is warm and dry. Capillary refill takes less than 2 seconds.  3cm raised, erythematous, indurated area on anterior chest with central area of fluctuance, +TTP  Psychiatric: He has a normal mood and affect. His behavior is normal.  Vitals reviewed.       Assessment & Plan:   1. Abscess - abscess not improved with antibiotic therapy (7d course of bactrim) - now with fluctuance and amenable to I&D - I&D performed as below - also treat with Doxycycline x10d - warm compresses to encourage ongoing drainage - return precautions discussed - f/u in 1 wk to ensure it is improving    Meds ordered this encounter  Medications  . doxycycline (VIBRA-TABS) 100 MG tablet    Sig: Take 1 tablet (100 mg total) by mouth 2 (two) times daily.    Dispense:  20 tablet    Refill:  0    Incision and Drainage Procedure Note  Pre-operative Diagnosis: abscess  Post-operative Diagnosis: same  Anesthesia: 1% plain lidocaine (1cc)  Procedure Details  The procedure,  risks and complications have been discussed in detail (including, but not limited to infection, bleeding) with the patient, and the patient has signed consent to the procedure.  The skin was prepped with betadine in the usual fashion. After adequate local anesthesia, I&D with a #11 blade was performed on the anterior chest. Purulent drainage: present The patient was observed until stable.  Condition: Tolerated procedure well  Complications: none.    Return in about 1 week (around 12/24/2017) for abscess f/u.   The entirety of the information  documented in the History of Present Illness, Review of Systems and Physical Exam were personally obtained by me. Portions of this information were initially documented by Irving Burton Ratchford, CMA and reviewed by me for thoroughness and accuracy.    Erasmo Downer, MD, MPH [facility] 12/17/2017 9:31 AM

## 2017-12-24 ENCOUNTER — Ambulatory Visit: Payer: Commercial Managed Care - PPO | Admitting: Family Medicine

## 2017-12-24 ENCOUNTER — Encounter: Payer: Self-pay | Admitting: Family Medicine

## 2017-12-24 VITALS — BP 116/66 | HR 85 | Temp 98.2°F | Resp 16 | Wt 181.0 lb

## 2017-12-24 DIAGNOSIS — L0291 Cutaneous abscess, unspecified: Secondary | ICD-10-CM

## 2017-12-24 NOTE — Progress Notes (Signed)
       Patient: Nicholas Villanueva Male    DOB: 1996-02-28   21 y.o.   MRN: [id number] Visit Date: 12/24/2017  Today's Provider: Shirlee LatchAngela Bacigalupo, MD   I, Joslyn HyEmily Ratchford, CMA, am acting as scribe for Shirlee LatchAngela Bacigalupo, MD.  Chief Complaint  Patient presents with  . Abcess   Subjective:    HPI     Follow up for Abcess  The patient was last seen for this 1 weeks ago. Changes made at last visit include performing an I&D, and treating with a 10 day course of Doxycycline.  He reports good compliance with treatment. He feels that condition is Improved. Size has decreased, and redness has improved. Some yellow drainage at first, which is subsiding.  He is having side effects. Some nausea. This is manageable.   ------------------------------------------------------------------------------------    Allergies  Allergen Reactions  . Other Anaphylaxis    Peanuts     Current Outpatient Medications:  .  diphenhydrAMINE (BENADRYL) 25 MG tablet, Take 25 mg by mouth every 6 (six) hours as needed., Disp: , Rfl:  .  doxycycline (VIBRA-TABS) 100 MG tablet, Take 1 tablet (100 mg total) by mouth 2 (two) times daily., Disp: 20 tablet, Rfl: 0 .  EPINEPHrine 0.3 mg/0.3 mL IJ SOAJ injection, INJECT 0.3 MLS (0.3 MG TOTAL) INTO THE MUSCLE ONCE FOR 1 DOSE., Disp: , Rfl: 2  Review of Systems  Constitutional: Negative for activity change, appetite change, chills, diaphoresis, fatigue, fever and unexpected weight change.  Gastrointestinal: Positive for nausea.  Skin: Positive for wound.    Social History   Tobacco Use  . Smoking status: Never Smoker  . Smokeless tobacco: Never Used  Substance Use Topics  . Alcohol use: No    Alcohol/week: 0.0 oz   Objective:   BP 116/66 (BP Location: Left Arm, Patient Position: Sitting, Cuff Size: Normal)   Pulse 85   Temp 98.2 F (36.8 C) (Oral)   Resp 16   Wt 181 lb (82.1 kg)   SpO2 96%   BMI 26.73 kg/m  Vitals:   12/24/17 0846  BP: 116/66    Pulse: 85  Resp: 16  Temp: 98.2 F (36.8 C)  TempSrc: Oral  SpO2: 96%  Weight: 181 lb (82.1 kg)     Physical Exam  Constitutional: He is oriented to person, place, and time. He appears well-developed and well-nourished. No distress.  HENT:  Head: Normocephalic and atraumatic.  Eyes: Conjunctivae are normal.  Cardiovascular: Normal rate and regular rhythm.  Pulmonary/Chest: Effort normal. No respiratory distress.  Musculoskeletal: He exhibits no edema.  Neurological: He is alert and oriented to person, place, and time.  Skin: Skin is warm and dry. Capillary refill takes less than 2 seconds.  Small area of induration and erythema with no fluctuance  Psychiatric: He has a normal mood and affect. His behavior is normal.  Vitals reviewed.       Assessment & Plan:   1. Abscess - much improved - almost resolved - continue course of doycycline to complete 10d course -  Return precautions discussed   Return if symptoms worsen or fail to improve.   The entirety of the information documented in the History of Present Illness, Review of Systems and Physical Exam were personally obtained by me. Portions of this information were initially documented by Irving BurtonEmily Ratchford, CMA and reviewed by me for thoroughness and accuracy.    Erasmo DownerBacigalupo, Angela M, MD, MPH [facility] 12/24/2017 9:31 AM

## 2018-06-16 ENCOUNTER — Ambulatory Visit: Payer: Commercial Managed Care - PPO | Admitting: Family Medicine

## 2018-06-16 ENCOUNTER — Encounter: Payer: Self-pay | Admitting: Family Medicine

## 2018-06-16 VITALS — BP 116/74 | HR 70 | Temp 98.3°F | Wt 175.8 lb

## 2018-06-16 DIAGNOSIS — B9789 Other viral agents as the cause of diseases classified elsewhere: Secondary | ICD-10-CM

## 2018-06-16 DIAGNOSIS — J069 Acute upper respiratory infection, unspecified: Secondary | ICD-10-CM | POA: Diagnosis not present

## 2018-06-16 MED ORDER — BENZONATATE 100 MG PO CAPS: 100.0000 mg | ORAL_CAPSULE | Freq: Two times a day (BID) | ORAL | 0 refills | Status: DC | PRN

## 2018-06-16 NOTE — Patient Instructions (Signed)

## 2018-06-16 NOTE — Progress Notes (Signed)
Patient: Nicholas Villanueva Male    DOB: 1995-12-29   23 y.o.   MRN: [id number] Visit Date: 06/16/2018  Today's Provider: Shirlee Latch, MD   Chief Complaint  Patient presents with  . URI   Subjective:    I, Presley Raddle, CMA, am acting as a scribe for Shirlee Latch, MD.   URI   This is a new problem. Episode onset: Wednesday. The problem has been gradually worsening. There has been no fever. Associated symptoms include congestion and coughing. Treatments tried: Mucinex and Delsym. The treatment provided mild relief.    Allergies  Allergen Reactions  . Other Anaphylaxis    Peanuts     Current Outpatient Medications:  .  diphenhydrAMINE (BENADRYL) 25 MG tablet, Take 25 mg by mouth every 6 (six) hours as needed., Disp: , Rfl:  .  doxycycline (VIBRA-TABS) 100 MG tablet, Take 1 tablet (100 mg total) by mouth 2 (two) times daily., Disp: 20 tablet, Rfl: 0 .  EPINEPHrine 0.3 mg/0.3 mL IJ SOAJ injection, INJECT 0.3 MLS (0.3 MG TOTAL) INTO THE MUSCLE ONCE FOR 1 DOSE., Disp: , Rfl: 2  Review of Systems  Constitutional: Negative.   HENT: Positive for congestion.   Respiratory: Positive for cough.   Cardiovascular: Negative.   Musculoskeletal: Negative.     Social History   Tobacco Use  . Smoking status: Never Smoker  . Smokeless tobacco: Never Used  Substance Use Topics  . Alcohol use: No    Alcohol/week: 0.0 standard drinks      Objective:   There were no vitals taken for this visit. There were no vitals filed for this visit.   Physical Exam Vitals signs reviewed.  Constitutional:      General: He is not in acute distress.    Appearance: Normal appearance. He is well-developed. He is not diaphoretic.  HENT:     Head: Normocephalic and atraumatic.     Right Ear: Tympanic membrane, ear canal and external ear normal.     Left Ear: Tympanic membrane, ear canal and external ear normal.     Nose: Nose normal.     Right Sinus: No maxillary sinus tenderness or  frontal sinus tenderness.     Left Sinus: No maxillary sinus tenderness or frontal sinus tenderness.     Mouth/Throat:     Mouth: Mucous membranes are moist.     Tongue: No lesions.     Palate: No lesions.     Pharynx: Oropharynx is clear. Posterior oropharyngeal erythema present. No oropharyngeal exudate.     Tonsils: No tonsillar exudate.  Eyes:     General: No scleral icterus.    Conjunctiva/sclera: Conjunctivae normal.     Pupils: Pupils are equal, round, and reactive to light.  Neck:     Musculoskeletal: Neck supple.     Thyroid: No thyromegaly.  Cardiovascular:     Rate and Rhythm: Normal rate and regular rhythm.     Pulses: Normal pulses.     Heart sounds: Normal heart sounds. No murmur.  Pulmonary:     Effort: Pulmonary effort is normal. No respiratory distress.     Breath sounds: Normal breath sounds. No wheezing or rales.  Abdominal:     General: There is no distension.     Palpations: Abdomen is soft.     Tenderness: There is no abdominal tenderness.  Musculoskeletal:     Right lower leg: No edema.     Left lower leg: No edema.  Lymphadenopathy:  Cervical: No cervical adenopathy.  Skin:    General: Skin is warm and dry.     Capillary Refill: Capillary refill takes less than 2 seconds.     Findings: No rash.  Neurological:     Mental Status: He is alert and oriented to person, place, and time.  Psychiatric:        Mood and Affect: Mood normal.        Behavior: Behavior normal.         Assessment & Plan   1. Viral URI with cough - symptoms and exam c/w viral URI - no evidence of strep pharyngitis, CAP, AOM, bacterial sinusitis, or other bacterial infection - discussed symptomatic management, natural course, and return precautions - does have h/o asthma, but no wheezing or SOB present currently    Meds ordered this encounter  Medications  . benzonatate (TESSALON) 100 MG capsule    Sig: Take 1 capsule (100 mg total) by mouth 2 (two) times daily as  needed for cough.    Dispense:  30 capsule    Refill:  0     Return if symptoms worsen or fail to improve.   The entirety of the information documented in the History of Present Illness, Review of Systems and Physical Exam were personally obtained by me. Portions of this information were initially documented by Presley RaddleNikki Walston, CMA and reviewed by me for thoroughness and accuracy.    Erasmo DownerBacigalupo, Angela M, MD, MPH [facility] 06/16/2018 10:13 AM

## 2018-06-17 ENCOUNTER — Telehealth: Payer: Self-pay | Admitting: Family Medicine

## 2018-06-17 MED ORDER — ALBUTEROL SULFATE HFA 108 (90 BASE) MCG/ACT IN AERS: 2.0000 | INHALATION_SPRAY | Freq: Four times a day (QID) | RESPIRATORY_TRACT | 0 refills | Status: AC | PRN

## 2018-06-17 MED ORDER — PREDNISONE 20 MG PO TABS: 40.0000 mg | ORAL_TABLET | Freq: Every day | ORAL | 0 refills | Status: AC

## 2018-06-17 NOTE — Telephone Encounter (Signed)
Patient's mother Rivka Barbara advised.

## 2018-06-17 NOTE — Telephone Encounter (Signed)
Pt's Mother calling for him.  He is worse today.  Loosing breath and throwing up from coughing. Not sleeping from coughing.  Please advise.  Please call her back at work. [phone]  Thanks, TGH

## 2018-06-17 NOTE — Telephone Encounter (Signed)
Sounds like this may have progressed to an asthma exacerbation.  Rx for prednisone (burst for 7 days, take in the mornings with food) and albuterol inhaler (use as needed for coughing, wheezing, SOB) sent to pharmacy.

## 2018-08-07 ENCOUNTER — Encounter: Payer: Self-pay | Admitting: Family Medicine

## 2018-08-07 ENCOUNTER — Ambulatory Visit (INDEPENDENT_AMBULATORY_CARE_PROVIDER_SITE_OTHER): Payer: Commercial Managed Care - PPO | Admitting: Family Medicine

## 2018-08-07 ENCOUNTER — Other Ambulatory Visit: Payer: Self-pay

## 2018-08-07 VITALS — BP 110/76 | HR 73 | Temp 98.8°F | Wt 180.4 lb

## 2018-08-07 DIAGNOSIS — Z0000 Encounter for general adult medical examination without abnormal findings: Secondary | ICD-10-CM

## 2018-08-07 DIAGNOSIS — E663 Overweight: Secondary | ICD-10-CM | POA: Diagnosis not present

## 2018-08-07 DIAGNOSIS — Z23 Encounter for immunization: Secondary | ICD-10-CM

## 2018-08-07 DIAGNOSIS — J45990 Exercise induced bronchospasm: Secondary | ICD-10-CM | POA: Diagnosis not present

## 2018-08-07 NOTE — Patient Instructions (Signed)
Preventive Care 18-39 Years, Male Preventive care refers to lifestyle choices and visits with your health care provider that can promote health and wellness. What does preventive care include?   A yearly physical exam. This is also called an annual well check.  Dental exams once or twice a year.  Routine eye exams. Ask your health care provider how often you should have your eyes checked.  Personal lifestyle choices, including: ? Daily care of your teeth and gums. ? Regular physical activity. ? Eating a healthy diet. ? Avoiding tobacco and drug use. ? Limiting alcohol use. ? Practicing safe sex. What happens during an annual well check? The services and screenings done by your health care provider during your annual well check will depend on your age, overall health, lifestyle risk factors, and family history of disease. Counseling Your health care provider may ask you questions about your:  Alcohol use.  Tobacco use.  Drug use.  Emotional well-being.  Home and relationship well-being.  Sexual activity.  Eating habits.  Work and work environment. Screening You may have the following tests or measurements:  Height, weight, and BMI.  Blood pressure.  Lipid and cholesterol levels. These may be checked every 5 years starting at age 20.  Diabetes screening. This is done by checking your blood sugar (glucose) after you have not eaten for a while (fasting).  Skin check.  Hepatitis C blood test.  Hepatitis B blood test.  Sexually transmitted disease (STD) testing. Discuss your test results, treatment options, and if necessary, the need for more tests with your health care provider. Vaccines Your health care provider may recommend certain vaccines, such as:  Influenza vaccine. This is recommended every year.  Tetanus, diphtheria, and acellular pertussis (Tdap, Td) vaccine. You may need a Td booster every 10 years.  Varicella vaccine. You may need this if you  have not been vaccinated.  HPV vaccine. If you are 26 or younger, you may need three doses over 6 months.  Measles, mumps, and rubella (MMR) vaccine. You may need at least one dose of MMR.You may also need a second dose.  Pneumococcal 13-valent conjugate (PCV13) vaccine. You may need this if you have certain conditions and have not been vaccinated.  Pneumococcal polysaccharide (PPSV23) vaccine. You may need one or two doses if you smoke cigarettes or if you have certain conditions.  Meningococcal vaccine. One dose is recommended if you are age 19-21 years and a first-year college student living in a residence hall, or if you have one of several medical conditions. You may also need additional booster doses.  Hepatitis A vaccine. You may need this if you have certain conditions or if you travel or work in places where you may be exposed to hepatitis A.  Hepatitis B vaccine. You may need this if you have certain conditions or if you travel or work in places where you may be exposed to hepatitis B.  Haemophilus influenzae type b (Hib) vaccine. You may need this if you have certain risk factors. Talk to your health care provider about which screenings and vaccines you need and how often you need them. This information is not intended to replace advice given to you by your health care provider. Make sure you discuss any questions you have with your health care provider. Document Released: 07/17/2001 Document Revised: 01/01/2017 Document Reviewed: 03/22/2015 Elsevier Interactive Patient Education  2019 Elsevier Inc.  

## 2018-08-07 NOTE — Assessment & Plan Note (Signed)
Well controlled Continue albuterol prn with exercise

## 2018-08-07 NOTE — Assessment & Plan Note (Signed)
Discussed diet and exercise 

## 2018-08-07 NOTE — Progress Notes (Signed)
Patient: Nicholas Villanueva, Male    DOB: [birth year], 23 y.o.   MRN: [id number] Visit Date: 08/07/2018  Today's Provider: Lavon Paganini, MD   Chief Complaint  Patient presents with  . Annual Exam   Subjective:     Annual physical exam Nicholas Villanueva is a 23 y.o. male who presents today for health maintenance and complete physical. He feels fairly well. He reports he does not exercise . He reports he is sleeping well.   -----------------------------------------------------------------   Review of Systems  Constitutional: Negative.   HENT: Negative.   Eyes: Negative.   Respiratory: Negative.   Cardiovascular: Negative.   Gastrointestinal: Negative.   Endocrine: Negative.   Genitourinary: Negative.   Musculoskeletal: Negative.   Skin: Negative.   Allergic/Immunologic: Positive for food allergies.  Neurological: Negative.   Hematological: Negative.   Psychiatric/Behavioral: Negative.     Social History      He  reports that he has never smoked. He has never used smokeless tobacco. He reports that he does not drink alcohol or use drugs.       Social History   Socioeconomic History  . Marital status: Single    Spouse name: Not on file  . Number of children: 0  . Years of education: Not on file  . Highest education level: Some college, no degree  Occupational History  . Occupation: Fish farm manager  . Financial resource strain: Not very hard  . Food insecurity:    Worry: Never true    Inability: Never true  . Transportation needs:    Medical: No    Non-medical: No  Tobacco Use  . Smoking status: Never Smoker  . Smokeless tobacco: Never Used  Substance and Sexual Activity  . Alcohol use: No    Alcohol/week: 0.0 standard drinks  . Drug use: No  . Sexual activity: Never  Lifestyle  . Physical activity:    Days per week: 0 days    Minutes per session: 0 min  . Stress: Not on file  Relationships  . Social connections:    Talks on phone: Not  on file    Gets together: Not on file    Attends religious service: Not on file    Active member of club or organization: Not on file    Attends meetings of clubs or organizations: Not on file    Relationship status: Not on file  Other Topics Concern  . Not on file  Social History Narrative  . Not on file    Past Medical History:  Diagnosis Date  . ADHD (attention deficit hyperactivity disorder)   . Asthma      Patient Active Problem List   Diagnosis Date Noted  . Overweight 08/07/2018  . ADHD 08/05/2017  . Exercise-induced asthma 08/05/2017    Past Surgical History:  Procedure Laterality Date  . PILONIDAL CYST EXCISION N/A 12/12/2015   Procedure: CYST EXCISION PILONIDAL EXTENSIVE;  Surgeon: Robert Bellow, MD;  Location: [facility] ORS;  Service: General;  Laterality: N/A;  . skin biopsies  8 years ago   neck  . WISDOM TOOTH EXTRACTION      Family History        Family Status  Relation Name Status  . Mother  Alive  . Father  Alive  . Brother  Alive  . MGM  Alive  . MGF  Alive  . PGM  Alive  . PGF  Alive  . Neg Hx  (Not Specified)  His family history includes Arthritis in his mother; Breast cancer in his maternal grandmother and mother; Diabetes in his maternal grandfather and paternal grandmother; Healthy in his brother and father; Hypertension in his maternal grandfather, paternal grandfather, and paternal grandmother; Rheum arthritis in his maternal grandmother; Skin cancer in his maternal grandfather and paternal grandfather. There is no history of Colon cancer or Prostate cancer.      Allergies  Allergen Reactions  . Other Anaphylaxis    Peanuts     Current Outpatient Medications:  .  albuterol (PROVENTIL HFA;VENTOLIN HFA) 108 (90 Base) MCG/ACT inhaler, Inhale 2 puffs into the lungs every 6 (six) hours as needed for wheezing or shortness of breath., Disp: 1 Inhaler, Rfl: 0 .  EPINEPHrine 0.3 mg/0.3 mL IJ SOAJ injection, INJECT 0.3 MLS (0.3 MG TOTAL)  INTO THE MUSCLE ONCE FOR 1 DOSE., Disp: , Rfl: 2   Patient Care Team: Virginia Crews, MD as PCP - General (Family Medicine) Dasher, Rayvon Char, MD (Dermatology) Robert Bellow, MD (General Surgery)    Objective:    Vitals: BP 110/76 (BP Location: Left Arm, Patient Position: Sitting, Cuff Size: Normal)   Pulse 73   Temp 98.8 F (37.1 C) (Oral)   Wt 180 lb 6.4 oz (81.8 kg)   SpO2 97%   BMI 26.64 kg/m    Vitals:   08/07/18 1001  BP: 110/76  Pulse: 73  Temp: 98.8 F (37.1 C)  TempSrc: Oral  SpO2: 97%  Weight: 180 lb 6.4 oz (81.8 kg)     Physical Exam Vitals signs reviewed.  Constitutional:      General: He is not in acute distress.    Appearance: Normal appearance. He is well-developed. He is not diaphoretic.  HENT:     Head: Normocephalic and atraumatic.     Right Ear: Tympanic membrane, ear canal and external ear normal.     Left Ear: Tympanic membrane, ear canal and external ear normal.     Nose: Nose normal.     Mouth/Throat:     Mouth: Mucous membranes are moist.     Pharynx: Oropharynx is clear. No oropharyngeal exudate.  Eyes:     General: No scleral icterus.    Conjunctiva/sclera: Conjunctivae normal.     Pupils: Pupils are equal, round, and reactive to light.  Neck:     Musculoskeletal: Neck supple.     Thyroid: No thyromegaly.  Cardiovascular:     Rate and Rhythm: Normal rate and regular rhythm.     Pulses: Normal pulses.     Heart sounds: Normal heart sounds. No murmur.  Pulmonary:     Effort: Pulmonary effort is normal. No respiratory distress.     Breath sounds: Normal breath sounds. No wheezing or rales.  Abdominal:     General: Bowel sounds are normal. There is no distension.     Palpations: Abdomen is soft.     Tenderness: There is no abdominal tenderness. There is no guarding or rebound.  Musculoskeletal:        General: No deformity.     Right lower leg: No edema.     Left lower leg: No edema.  Lymphadenopathy:     Cervical: No  cervical adenopathy.  Skin:    General: Skin is warm and dry.     Capillary Refill: Capillary refill takes less than 2 seconds.     Findings: No rash.  Neurological:     Mental Status: He is alert and oriented to person, place, and time.  Mental status is at baseline.  Psychiatric:        Mood and Affect: Mood normal.        Behavior: Behavior normal.        Thought Content: Thought content normal.      Depression Screen PHQ 2/9 Scores 08/07/2018  PHQ - 2 Score 1  PHQ- 9 Score 7       Assessment & Plan:     Routine Health Maintenance and Physical Exam  Exercise Activities and Dietary recommendations Goals   None     Immunization History  Administered Date(s) Administered  . DTaP 05/03/1995, 07/05/1995, 09/06/1995, 08/14/1996, 01/13/2001  . HPV Quadrivalent 01/23/2011, 03/23/2011, 01/25/2012  . Hepatitis A 01/30/2013  . Hepatitis A, Adult 08/05/2017  . Hepatitis B Dec 22, 1994, 05/03/1995, 12/05/1995  . HiB (PRP-OMP) 05/03/1995, 07/05/1995, 09/06/1995, 08/14/1996  . IPV 05/03/1995, 07/05/1995, 09/06/1995, 01/13/2001  . Influenza,inj,Quad PF,6+ Mos 08/07/2018  . MMR 03/06/1996, 01/13/2001  . Meningococcal Mcv4o 01/20/2013  . Td 08/05/2017  . Tdap 11/22/2006  . Varicella 03/06/1996, 01/23/2011    Health Maintenance  Topic Date Due  . HIV Screening  02/19/2010  . TETANUS/TDAP  08/06/2027  . INFLUENZA VACCINE  Completed     Discussed health benefits of physical activity, and encouraged him to engage in regular exercise appropriate for his age and condition.    --------------------------------------------------------------------  Problem List Items Addressed This Visit      Respiratory   Exercise-induced asthma    Well controlled Continue albuterol prn with exercise        Other   Overweight    Discussed diet and exercise      Relevant Orders   CBC w/Diff/Platelet   Comprehensive metabolic panel    Other Visit Diagnoses    Encounter for annual  physical exam    -  Primary   Relevant Orders   CBC w/Diff/Platelet   Comprehensive metabolic panel       Return in about 1 year (around 08/07/2019) for CPE.   The entirety of the information documented in the History of Present Illness, Review of Systems and Physical Exam were personally obtained by me. Portions of this information were initially documented by Tiburcio Pea and Lia Foyer, CMA and reviewed by me for thoroughness and accuracy.    Virginia Crews, MD, MPH [facility] 08/07/2018 10:39 AM

## 2018-08-08 LAB — COMPREHENSIVE METABOLIC PANEL
A/G RATIO: 2.5 — AB (ref 1.2–2.2)
ALT: 11 IU/L (ref 0–44)
AST: 14 IU/L (ref 0–40)
Albumin: 4.3 g/dL (ref 4.1–5.2)
Alkaline Phosphatase: 62 IU/L (ref 39–117)
BUN/Creatinine Ratio: 14 (ref 9–20)
BUN: 13 mg/dL (ref 6–20)
Bilirubin Total: 0.5 mg/dL (ref 0.0–1.2)
CO2: 25 mmol/L (ref 20–29)
Calcium: 9.3 mg/dL (ref 8.7–10.2)
Chloride: 103 mmol/L (ref 96–106)
Creatinine, Ser: 0.94 mg/dL (ref 0.76–1.27)
GFR, EST AFRICAN AMERICAN: 132 mL/min/{1.73_m2} (ref >59)
GFR, EST NON AFRICAN AMERICAN: 114 mL/min/{1.73_m2} (ref >59)
GLUCOSE: 83 mg/dL (ref 65–99)
Globulin, Total: 1.7 g/dL (ref 1.5–4.5)
POTASSIUM: 4.3 mmol/L (ref 3.5–5.2)
Sodium: 143 mmol/L (ref 134–144)
TOTAL PROTEIN: 6 g/dL (ref 6.0–8.5)

## 2018-08-08 LAB — CBC WITH DIFFERENTIAL/PLATELET
BASOS ABS: 0 10*3/uL (ref 0.0–0.2)
BASOS: 0 %
EOS (ABSOLUTE): 0.1 10*3/uL (ref 0.0–0.4)
Eos: 1 %
Hematocrit: 47.7 % (ref 37.5–51.0)
Hemoglobin: 15.9 g/dL (ref 13.0–17.7)
IMMATURE GRANS (ABS): 0 10*3/uL (ref 0.0–0.1)
IMMATURE GRANULOCYTES: 0 %
Lymphocytes Absolute: 1.2 10*3/uL (ref 0.7–3.1)
Lymphs: 17 %
MCH: 29.8 pg (ref 26.6–33.0)
MCHC: 33.3 g/dL (ref 31.5–35.7)
MCV: 89 fL (ref 79–97)
MONOS ABS: 1 10*3/uL — AB (ref 0.1–0.9)
Monocytes: 13 %
NEUTROS PCT: 69 %
Neutrophils Absolute: 5.1 10*3/uL (ref 1.4–7.0)
PLATELETS: 282 10*3/uL (ref 150–450)
RBC: 5.34 x10E6/uL (ref 4.14–5.80)
RDW: 13 % (ref 11.6–15.4)
WBC: 7.5 10*3/uL (ref 3.4–10.8)

## 2019-08-10 ENCOUNTER — Other Ambulatory Visit: Payer: Self-pay

## 2019-08-10 ENCOUNTER — Ambulatory Visit (INDEPENDENT_AMBULATORY_CARE_PROVIDER_SITE_OTHER): Payer: Commercial Managed Care - PPO | Admitting: Family Medicine

## 2019-08-10 ENCOUNTER — Encounter: Payer: Self-pay | Admitting: Family Medicine

## 2019-08-10 VITALS — BP 105/71 | HR 79 | Temp 97.7°F | Ht 69.0 in | Wt 174.0 lb

## 2019-08-10 DIAGNOSIS — Z0000 Encounter for general adult medical examination without abnormal findings: Secondary | ICD-10-CM | POA: Diagnosis not present

## 2019-08-10 DIAGNOSIS — Z114 Encounter for screening for human immunodeficiency virus [HIV]: Secondary | ICD-10-CM | POA: Diagnosis not present

## 2019-08-10 DIAGNOSIS — F320 Major depressive disorder, single episode, mild: Secondary | ICD-10-CM | POA: Insufficient documentation

## 2019-08-10 DIAGNOSIS — J45990 Exercise induced bronchospasm: Secondary | ICD-10-CM

## 2019-08-10 DIAGNOSIS — E663 Overweight: Secondary | ICD-10-CM | POA: Diagnosis not present

## 2019-08-10 NOTE — Progress Notes (Signed)
Patient: Nicholas Villanueva, Male    DOB: 05/22/1996, 24 y.o.   MRN: [id number] Visit Date: 08/10/2019  Today's Provider: Lavon Paganini, MD   Chief Complaint  Patient presents with  . Annual Exam   Subjective:     Annual physical exam Nicholas Villanueva is a 24 y.o. male who presents today for health maintenance and complete physical. He feels well. He reports not  exercising. He reports he is sleeping well.   Reports some depressive symptoms that are fluctuating in nature.  Some passive SI with no active plan.  Feels like inability to see other people or do things that he likes to do due to the pandemic have affected this.  He has never taken a medication for depression previously.  He is interested in pursuing therapy, but has been hesitant due to Covid restrictions. -----------------------------------------------------------------   Review of Systems  Constitutional: Negative.   HENT: Negative.   Eyes: Negative.   Respiratory: Negative.   Cardiovascular: Negative.   Gastrointestinal: Negative.   Endocrine: Negative.   Genitourinary: Negative.   Musculoskeletal: Negative.   Skin: Negative.   Allergic/Immunologic: Negative.   Neurological: Negative.   Hematological: Negative.   Psychiatric/Behavioral: Negative.     Social History      He  reports that he has never smoked. He has never used smokeless tobacco. He reports that he does not drink alcohol or use drugs.       Social History   Socioeconomic History  . Marital status: Single    Spouse name: Not on file  . Number of children: 0  . Years of education: Not on file  . Highest education level: Some college, no degree  Occupational History  . Occupation: Psychologist, educational  Tobacco Use  . Smoking status: Never Smoker  . Smokeless tobacco: Never Used  Substance and Sexual Activity  . Alcohol use: No    Alcohol/week: 0.0 standard drinks  . Drug use: No  . Sexual activity: Never  Other Topics Concern  . Not on  file  Social History Narrative  . Not on file   Social Determinants of Health   Financial Resource Strain:   . Difficulty of Paying Living Expenses: Not on file  Food Insecurity:   . Worried About Charity fundraiser in the Last Year: Not on file  . Ran Out of Food in the Last Year: Not on file  Transportation Needs:   . Lack of Transportation (Medical): Not on file  . Lack of Transportation (Non-Medical): Not on file  Physical Activity:   . Days of Exercise per Week: Not on file  . Minutes of Exercise per Session: Not on file  Stress:   . Feeling of Stress : Not on file  Social Connections:   . Frequency of Communication with Friends and Family: Not on file  . Frequency of Social Gatherings with Friends and Family: Not on file  . Attends Religious Services: Not on file  . Active Member of Clubs or Organizations: Not on file  . Attends Archivist Meetings: Not on file  . Marital Status: Not on file    Past Medical History:  Diagnosis Date  . ADHD (attention deficit hyperactivity disorder)   . Asthma      Patient Active Problem List   Diagnosis Date Noted  . Overweight 08/07/2018  . ADHD 08/05/2017  . Exercise-induced asthma 08/05/2017    Past Surgical History:  Procedure Laterality Date  . PILONIDAL CYST  EXCISION N/A 12/12/2015   Procedure: CYST EXCISION PILONIDAL EXTENSIVE;  Surgeon: Robert Bellow, MD;  Location: [facility] ORS;  Service: General;  Laterality: N/A;  . skin biopsies  8 years ago   neck  . WISDOM TOOTH EXTRACTION      Family History        Family Status  Relation Name Status  . Mother  Alive  . Father  Alive  . Brother  Alive  . MGM  Alive  . MGF  Alive  . PGM  Alive  . PGF  Alive  . Neg Hx  (Not Specified)        His family history includes Arthritis in his mother; Breast cancer in his maternal grandmother and mother; Diabetes in his maternal grandfather and paternal grandmother; Healthy in his brother and father; Hypertension in  his maternal grandfather, paternal grandfather, and paternal grandmother; Rheum arthritis in his maternal grandmother; Skin cancer in his maternal grandfather and paternal grandfather. There is no history of Colon cancer or Prostate cancer.      Allergies  Allergen Reactions  . Other Anaphylaxis    Peanuts     Current Outpatient Medications:  .  albuterol (PROVENTIL HFA;VENTOLIN HFA) 108 (90 Base) MCG/ACT inhaler, Inhale 2 puffs into the lungs every 6 (six) hours as needed for wheezing or shortness of breath., Disp: 1 Inhaler, Rfl: 0 .  EPINEPHrine 0.3 mg/0.3 mL IJ SOAJ injection, INJECT 0.3 MLS (0.3 MG TOTAL) INTO THE MUSCLE ONCE FOR 1 DOSE., Disp: , Rfl: 2   Patient Care Team: Virginia Crews, MD as PCP - General (Family Medicine) Dasher, Rayvon Char, MD (Dermatology) Robert Bellow, MD (General Surgery)    Objective:    Vitals: BP 105/71 (BP Location: Right Arm, Patient Position: Sitting, Cuff Size: Large)   Pulse 79   Temp 97.7 F (36.5 C) (Temporal)   Ht '5\' 9"'  (1.753 m)   Wt 174 lb (78.9 kg)   BMI 25.70 kg/m    Vitals:   08/10/19 1005  BP: 105/71  Pulse: 79  Temp: 97.7 F (36.5 C)  TempSrc: Temporal  Weight: 174 lb (78.9 kg)  Height: '5\' 9"'  (1.753 m)     Physical Exam Vitals reviewed.  Constitutional:      General: He is not in acute distress.    Appearance: Normal appearance. He is well-developed. He is not diaphoretic.  HENT:     Head: Normocephalic and atraumatic.     Right Ear: Tympanic membrane, ear canal and external ear normal.     Left Ear: Tympanic membrane, ear canal and external ear normal.  Eyes:     General: No scleral icterus.    Conjunctiva/sclera: Conjunctivae normal.     Pupils: Pupils are equal, round, and reactive to light.  Neck:     Thyroid: No thyromegaly.  Cardiovascular:     Rate and Rhythm: Normal rate and regular rhythm.     Pulses: Normal pulses.     Heart sounds: Normal heart sounds. No murmur.  Pulmonary:     Effort:  Pulmonary effort is normal. No respiratory distress.     Breath sounds: Normal breath sounds. No wheezing, rhonchi or rales.  Abdominal:     General: There is no distension.     Palpations: Abdomen is soft.     Tenderness: There is no abdominal tenderness.  Musculoskeletal:        General: No deformity.     Cervical back: Neck supple.     Right  lower leg: No edema.     Left lower leg: No edema.  Lymphadenopathy:     Cervical: No cervical adenopathy.  Skin:    General: Skin is warm and dry.     Capillary Refill: Capillary refill takes less than 2 seconds.     Findings: No rash.  Neurological:     Mental Status: He is alert and oriented to person, place, and time. Mental status is at baseline.     Cranial Nerves: No cranial nerve deficit.  Psychiatric:        Mood and Affect: Mood is depressed. Affect is flat.        Behavior: Behavior normal.        Thought Content: Thought content includes suicidal ideation. Thought content does not include homicidal ideation. Thought content does not include homicidal or suicidal plan.      Depression Screen PHQ 2/9 Scores 08/10/2019 08/07/2018  PHQ - 2 Score 4 1  PHQ- 9 Score 11 7   Depression screen [facility] 2/9 08/10/2019 08/07/2018  Decreased Interest 2 1  Down, Depressed, Hopeless 2 0  PHQ - 2 Score 4 1  Altered sleeping 1 0  Tired, decreased energy 2 2  Change in appetite 0 0  Feeling bad or failure about yourself  2 1  Trouble concentrating 1 2  Moving slowly or fidgety/restless 0 1  Suicidal thoughts 1 0  PHQ-9 Score 11 7  Difficult doing work/chores Very difficult Not difficult at all        Assessment & Plan:     Routine Health Maintenance and Physical Exam  Exercise Activities and Dietary recommendations Goals   None     Immunization History  Administered Date(s) Administered  . DTaP 05/03/1995, 07/05/1995, 09/06/1995, 08/14/1996, 01/13/2001  . HPV Quadrivalent 01/23/2011, 03/23/2011, 01/25/2012  . Hepatitis A  01/30/2013  . Hepatitis A, Adult 08/05/2017  . Hepatitis B 05-05-95, 05/03/1995, 12/05/1995  . HiB (PRP-OMP) 05/03/1995, 07/05/1995, 09/06/1995, 08/14/1996  . IPV 05/03/1995, 07/05/1995, 09/06/1995, 01/13/2001  . Influenza,inj,Quad PF,6+ Mos 08/07/2018  . MMR 03/06/1996, 01/13/2001  . Meningococcal Mcv4o 01/20/2013  . Td 08/05/2017  . Tdap 11/22/2006  . Varicella 03/06/1996, 01/23/2011    Health Maintenance  Topic Date Due  . HIV Screening  02/19/2010  . INFLUENZA VACCINE  09/02/2019 (Originally 01/03/2019)  . TETANUS/TDAP  08/06/2027     Discussed health benefits of physical activity, and encouraged him to engage in regular exercise appropriate for his age and condition.    --------------------------------------------------------------------  Problem List Items Addressed This Visit      Respiratory   Exercise-induced asthma    Well-controlled Continue albuterol as needed with exercise        Other   Overweight    Discussed importance of healthy weight management Discussed diet and exercise Medical student, Chipper Herb, did some motivational interviewing around getting more exercise and they plan to work on exercising 2 to 3 days a week for 30 minutes at a time, mostly walking with his dog      Relevant Orders   Lipid panel   Comprehensive metabolic panel   TSH   Current mild episode of major depressive disorder without prior episode ([place])    New problem Seems to be related to pandemic restrictions Has never taken a medication for depression previously We discussed therapy versus medical management and the synergistic effect between the 2 We will hold off on starting medication at this time, but if his symptoms worsen, he will give me  a call back and we can consider starting SSRI at that time Did encourage therapy and gave some resources for him to look into this He does have some mild passive SI, but contracted for safety and he has no active SI      Relevant  Orders   Comprehensive metabolic panel   TSH    Other Visit Diagnoses    Encounter for annual physical exam    -  Primary   Relevant Orders   Lipid panel   Comprehensive metabolic panel   HIV antibody (with reflex)   TSH   Encounter for screening for HIV       Relevant Orders   HIV antibody (with reflex)       Return in about 6 months (around 02/10/2020) for MDD f/u.   The entirety of the information documented in the History of Present Illness, Review of Systems and Physical Exam were personally obtained by me. Portions of this information were initially documented by Ashley Royalty, CMA and reviewed by me for thoroughness and accuracy.    Hershell Brandl, Dionne Bucy, MD MPH [facility]

## 2019-08-10 NOTE — Assessment & Plan Note (Signed)
Well-controlled Continue albuterol as needed with exercise

## 2019-08-10 NOTE — Assessment & Plan Note (Signed)
New problem Seems to be related to pandemic restrictions Has never taken a medication for depression previously We discussed therapy versus medical management and the synergistic effect between the 2 We will hold off on starting medication at this time, but if his symptoms worsen, he will give me a call back and we can consider starting SSRI at that time Did encourage therapy and gave some resources for him to look into this He does have some mild passive SI, but contracted for safety and he has no active SI

## 2019-08-10 NOTE — Patient Instructions (Signed)
[email or web address]  [facility]    Preventive Care 56-25 Years Old, Male Preventive care refers to lifestyle choices and visits with your health care provider that can promote health and wellness. This includes:  A yearly physical exam. This is also called an annual well check.  Regular dental and eye exams.  Immunizations.  Screening for certain conditions.  Healthy lifestyle choices, such as eating a healthy diet, getting regular exercise, not using drugs or products that contain nicotine and tobacco, and limiting alcohol use. What can I expect for my preventive care visit? Physical exam Your health care provider will check:  Height and weight. These may be used to calculate body mass index (BMI), which is a measurement that tells if you are at a healthy weight.  Heart rate and blood pressure.  Your skin for abnormal spots. Counseling Your health care provider may ask you questions about:  Alcohol, tobacco, and drug use.  Emotional well-being.  Home and relationship well-being.  Sexual activity.  Eating habits.  Work and work Statistician. What immunizations do I need?  Influenza (flu) vaccine  This is recommended every year. Tetanus, diphtheria, and pertussis (Tdap) vaccine  You may need a Td booster every 10 years. Varicella (chickenpox) vaccine  You may need this vaccine if you have not already been vaccinated. Human papillomavirus (HPV) vaccine  If recommended by your health care provider, you may need three doses over 6 months. Measles, mumps, and rubella (MMR) vaccine  You may need at least one dose of MMR. You may also need a second dose. Meningococcal conjugate (MenACWY) vaccine  One dose is recommended if you are 43-86 years old and a Market researcher living in a residence hall, or if you have one of several medical conditions. You may also need additional booster doses. Pneumococcal conjugate (PCV13) vaccine  You may need this  if you have certain conditions and were not previously vaccinated. Pneumococcal polysaccharide (PPSV23) vaccine  You may need one or two doses if you smoke cigarettes or if you have certain conditions. Hepatitis A vaccine  You may need this if you have certain conditions or if you travel or work in places where you may be exposed to hepatitis A. Hepatitis B vaccine  You may need this if you have certain conditions or if you travel or work in places where you may be exposed to hepatitis B. Haemophilus influenzae type b (Hib) vaccine  You may need this if you have certain risk factors. You may receive vaccines as individual doses or as more than one vaccine together in one shot (combination vaccines). Talk with your health care provider about the risks and benefits of combination vaccines. What tests do I need? Blood tests  Lipid and cholesterol levels. These may be checked every 5 years starting at age 32.  Hepatitis C test.  Hepatitis B test. Screening   Diabetes screening. This is done by checking your blood sugar (glucose) after you have not eaten for a while (fasting).  Sexually transmitted disease (STD) testing. Talk with your health care provider about your test results, treatment options, and if necessary, the need for more tests. Follow these instructions at home: Eating and drinking   Eat a diet that includes fresh fruits and vegetables, whole grains, lean protein, and low-fat dairy products.  Take vitamin and mineral supplements as recommended by your health care provider.  Do not drink alcohol if your health care provider tells you not to drink.  If you drink alcohol: ?  Limit how much you have to 0-2 drinks a day. ? Be aware of how much alcohol is in your drink. In the [place] one drink equals one 12 oz bottle of beer (355 mL), one 5 oz glass of wine (148 mL), or one 1 oz glass of hard liquor (44 mL). Lifestyle  Take daily care of your teeth and gums.  Stay  active. Exercise for at least 30 minutes on 5 or more days each week.  Do not use any products that contain nicotine or tobacco, such as cigarettes, e-cigarettes, and chewing tobacco. If you need help quitting, ask your health care provider.  If you are sexually active, practice safe sex. Use a condom or other form of protection to prevent STIs (sexually transmitted infections). What's next?  Go to your health care provider once a year for a well check visit.  Ask your health care provider how often you should have your eyes and teeth checked.  Stay up to date on all vaccines. This information is not intended to replace advice given to you by your health care provider. Make sure you discuss any questions you have with your health care provider. Document Revised: 05/15/2018 Document Reviewed: 05/15/2018 Elsevier Patient Education  2020 [employer].

## 2019-08-10 NOTE — Assessment & Plan Note (Signed)
Discussed importance of healthy weight management Discussed diet and exercise Medical student, Nicholas Villanueva, did some motivational interviewing around getting more exercise and they plan to work on exercising 2 to 3 days a week for 30 minutes at a time, mostly walking with his dog

## 2019-08-11 ENCOUNTER — Telehealth: Payer: Self-pay

## 2019-08-11 LAB — COMPREHENSIVE METABOLIC PANEL
ALT: 11 IU/L (ref 0–44)
AST: 17 IU/L (ref 0–40)
Albumin/Globulin Ratio: 3 — ABNORMAL HIGH (ref 1.2–2.2)
Albumin: 4.8 g/dL (ref 4.1–5.2)
Alkaline Phosphatase: 64 IU/L (ref 39–117)
BUN/Creatinine Ratio: 8 — ABNORMAL LOW (ref 9–20)
BUN: 8 mg/dL (ref 6–20)
Bilirubin Total: 0.5 mg/dL (ref 0.0–1.2)
CO2: 26 mmol/L (ref 20–29)
Calcium: 9.5 mg/dL (ref 8.7–10.2)
Chloride: 101 mmol/L (ref 96–106)
Creatinine, Ser: 1.02 mg/dL (ref 0.76–1.27)
GFR calc Af Amer: 118 mL/min/{1.73_m2} (ref >59)
GFR calc non Af Amer: 102 mL/min/{1.73_m2} (ref >59)
Globulin, Total: 1.6 g/dL (ref 1.5–4.5)
Glucose: 85 mg/dL (ref 65–99)
Potassium: 4.4 mmol/L (ref 3.5–5.2)
Sodium: 141 mmol/L (ref 134–144)
Total Protein: 6.4 g/dL (ref 6.0–8.5)

## 2019-08-11 LAB — TSH: TSH: 1.81 u[IU]/mL (ref 0.450–4.500)

## 2019-08-11 LAB — LIPID PANEL
Chol/HDL Ratio: 2.3 ratio (ref 0.0–5.0)
Cholesterol, Total: 178 mg/dL (ref 100–199)
HDL: 77 mg/dL (ref >39)
LDL Chol Calc (NIH): 88 mg/dL (ref 0–99)
Triglycerides: 72 mg/dL (ref 0–149)
VLDL Cholesterol Cal: 13 mg/dL (ref 5–40)

## 2019-08-11 LAB — HIV ANTIBODY (ROUTINE TESTING W REFLEX): HIV Screen 4th Generation wRfx: NONREACTIVE

## 2019-08-11 NOTE — Telephone Encounter (Signed)
Left detailed message on voicemail.  

## 2019-08-11 NOTE — Telephone Encounter (Signed)
-----   Message from Erasmo Downer, MD sent at 08/11/2019  8:06 AM EST ----- Normal labs

## 2019-08-11 NOTE — Telephone Encounter (Signed)
LMTCB or to View message/results througn my chart.

## 2020-02-15 ENCOUNTER — Ambulatory Visit: Payer: Commercial Managed Care - PPO | Admitting: Family Medicine

## 2020-02-15 ENCOUNTER — Encounter: Payer: Self-pay | Admitting: Family Medicine

## 2020-02-15 ENCOUNTER — Other Ambulatory Visit: Payer: Self-pay

## 2020-02-15 VITALS — BP 114/68 | HR 75 | Temp 98.2°F | Wt 165.0 lb

## 2020-02-15 DIAGNOSIS — Z23 Encounter for immunization: Secondary | ICD-10-CM

## 2020-02-15 DIAGNOSIS — F324 Major depressive disorder, single episode, in partial remission: Secondary | ICD-10-CM

## 2020-02-15 NOTE — Patient Instructions (Signed)
Living With Depression Everyone experiences occasional disappointment, sadness, and loss in their lives. When you are feeling down, blue, or sad for at least 2 weeks in a row, it may mean that you have depression. Depression can affect your thoughts and feelings, relationships, daily activities, and physical health. It is caused by changes in the way your brain functions. If you receive a diagnosis of depression, your health care provider will tell you which type of depression you have and what treatment options are available to you. If you are living with depression, there are ways to help you recover from it and also ways to prevent it from coming back. How to cope with lifestyle changes Coping with stress     Stress is your body's reaction to life changes and events, both good and bad. Stressful situations may include:  Getting married.  The death of a spouse.  Losing a job.  Retiring.  Having a baby. Stress can last just a few hours or it can be ongoing. Stress can play a major role in depression, so it is important to learn both how to cope with stress and how to think about it differently. Talk with your health care provider or a counselor if you would like to learn more about stress reduction. He or she may suggest some stress reduction techniques, such as:  Music therapy. This can include creating music or listening to music. Choose music that you enjoy and that inspires you.  Mindfulness-based meditation. This kind of meditation can be done while sitting or walking. It involves being aware of your normal breaths, rather than trying to control your breathing.  Centering prayer. This is a kind of meditation that involves focusing on a spiritual word or phrase. Choose a word, phrase, or sacred image that is meaningful to you and that brings you peace.  Deep breathing. To do this, expand your stomach and inhale slowly through your nose. Hold your breath for 3-5 seconds, then exhale  slowly, allowing your stomach muscles to relax.  Muscle relaxation. This involves intentionally tensing muscles then relaxing them. Choose a stress reduction technique that fits your lifestyle and personality. Stress reduction techniques take time and practice to develop. Set aside 5-15 minutes a day to do them. Therapists can offer training in these techniques. The training may be covered by some insurance plans. Other things you can do to manage stress include:  Keeping a stress diary. This can help you learn what triggers your stress and ways to control your response.  Understanding what your limits are and saying no to requests or events that lead to a schedule that is too full.  Thinking about how you respond to certain situations. You may not be able to control everything, but you can control how you react.  Adding humor to your life by watching funny films or TV shows.  Making time for activities that help you relax and not feeling guilty about spending your time this way.  Medicines Your health care provider may suggest certain medicines if he or she feels that they will help improve your condition. Avoid using alcohol and other substances that may prevent your medicines from working properly (may interact). It is also important to:  Talk with your pharmacist or health care provider about all the medicines that you take, their possible side effects, and what medicines are safe to take together.  Make it your goal to take part in all treatment decisions (shared decision-making). This includes giving input on   the side effects of medicines. It is best if shared decision-making with your health care provider is part of your total treatment plan. If your health care provider prescribes a medicine, you may not notice the full benefits of it for 4-8 weeks. Most people who are treated for depression need to be on medicine for at least 6-12 months after they feel better. If you are taking  medicines as part of your treatment, do not stop taking medicines without first talking to your health care provider. You may need to have the medicine slowly decreased (tapered) over time to decrease the risk of harmful side effects. Relationships Your health care provider may suggest family therapy along with individual therapy and drug therapy. While there may not be family problems that are causing you to feel depressed, it is still important to make sure your family learns as much as they can about your mental health. Having your family's support can help make your treatment successful. How to recognize changes in your condition Everyone has a different response to treatment for depression. Recovery from major depression happens when you have not had signs of major depression for two months. This may mean that you will start to:  Have more interest in doing activities.  Feel less hopeless than you did 2 months ago.  Have more energy.  Overeat less often, or have better or improving appetite.  Have better concentration. Your health care provider will work with you to decide the next steps in your recovery. It is also important to recognize when your condition is getting worse. Watch for these signs:  Having fatigue or low energy.  Eating too much or too little.  Sleeping too much or too little.  Feeling restless, agitated, or hopeless.  Having trouble concentrating or making decisions.  Having unexplained physical complaints.  Feeling irritable, angry, or aggressive. Get help as soon as you or your family members notice these symptoms coming back. How to get support and help from others How to talk with friends and family members about your condition  Talking to friends and family members about your condition can provide you with one way to get support and guidance. Reach out to trusted friends or family members, explain your symptoms to them, and let them know that you are  working with a health care provider to treat your depression. Financial resources Not all insurance plans cover mental health care, so it is important to check with your insurance carrier. If paying for co-pays or counseling services is a problem, search for a local or county mental health care center. They may be able to offer public mental health care services at low or no cost when you are not able to see a private health care provider. If you are taking medicine for depression, you may be able to get the generic form, which may be less expensive. Some makers of prescription medicines also offer help to patients who cannot afford the medicines they need. Follow these instructions at home:   Get the right amount and quality of sleep.  Cut down on using caffeine, tobacco, alcohol, and other potentially harmful substances.  Try to exercise, such as walking or lifting small weights.  Take over-the-counter and prescription medicines only as told by your health care provider.  Eat a healthy diet that includes plenty of vegetables, fruits, whole grains, low-fat dairy products, and lean protein. Do not eat a lot of foods that are high in solid fats, added sugars, or salt.    Keep all follow-up visits as told by your health care provider. This is important. Contact a health care provider if:  You stop taking your antidepressant medicines, and you have any of these symptoms: ? Nausea. ? Headache. ? Feeling lightheaded. ? Chills and body aches. ? Not being able to sleep (insomnia).  You or your friends and family think your depression is getting worse. Get help right away if:  You have thoughts of hurting yourself or others. If you ever feel like you may hurt yourself or others, or have thoughts about taking your own life, get help right away. You can go to your nearest emergency department or call:  Your local emergency services (911 in the [place]  A suicide crisis helpline, such as the  National Suicide Prevention Lifeline at 1-800-273-8255. This is open 24-hours a day. Summary  If you are living with depression, there are ways to help you recover from it and also ways to prevent it from coming back.  Work with your health care team to create a management plan that includes counseling, stress management techniques, and healthy lifestyle habits. This information is not intended to replace advice given to you by your health care provider. Make sure you discuss any questions you have with your health care provider. Document Revised: 09/12/2018 Document Reviewed: 04/23/2016 Elsevier Patient Education  2020 Elsevier Inc.  

## 2020-02-15 NOTE — Assessment & Plan Note (Signed)
Much improved from previous visit In partial remission No SI/HI Not on any treatment F/U in 6 months

## 2020-02-15 NOTE — Progress Notes (Signed)
Established patient visit   Patient: Nicholas Villanueva   DOB: 1995-12-10   24 y.o. Male  MRN: [id number] Visit Date: 02/15/2020  Today's healthcare provider: Shirlee Latch, MD   Chief Complaint  Patient presents with  . Anxiety   Subjective    HPI   Depression, Follow-up  He  was last seen for this 6 months ago. Changes made at last visit include none. Not on treatment.   Current symptoms include: anhedonia, fatigue and some difficulty concentrating, trouble relaxing He feels he is much Improved since last visit. He had an experience at work where he put his foot down and advocated for himself, and it has snowballed into feeling much better. No SI/HI.   He is also eating better and paying more attention to what he eats.  Depression screen [facility] 2/9 02/15/2020 08/10/2019 08/07/2018  Decreased Interest 2 2 1   Down, Depressed, Hopeless 0 2 0  PHQ - 2 Score 2 4 1   Altered sleeping 1 1 0  Tired, decreased energy 2 2 2   Change in appetite 0 0 0  Feeling bad or failure about yourself  0 2 1  Trouble concentrating 1 1 2   Moving slowly or fidgety/restless 0 0 1  Suicidal thoughts 0 1 0  PHQ-9 Score 6 11 7   Difficult doing work/chores Somewhat difficult Very difficult Not difficult at all    GAD 7 : Generalized Anxiety Score 02/15/2020  Nervous, Anxious, on Edge 1  Control/stop worrying 0  Worry too much - different things 0  Trouble relaxing 2  Restless 1  Easily annoyed or irritable 1  Afraid - awful might happen 1  Total GAD 7 Score 6  Anxiety Difficulty Somewhat difficult   -----------------------------------------------------------------------------------------   Patient Active Problem List   Diagnosis Date Noted  . Current mild episode of major depressive disorder without prior episode (HCC) 08/10/2019  . Overweight 08/07/2018  . ADHD 08/05/2017  . Exercise-induced asthma 08/05/2017    Medications: Outpatient Medications Prior to Visit  Medication Sig  .  albuterol (PROVENTIL HFA;VENTOLIN HFA) 108 (90 Base) MCG/ACT inhaler Inhale 2 puffs into the lungs every 6 (six) hours as needed for wheezing or shortness of breath.  . EPINEPHrine 0.3 mg/0.3 mL IJ SOAJ injection INJECT 0.3 MLS (0.3 MG TOTAL) INTO THE MUSCLE ONCE FOR 1 DOSE.   No facility-administered medications prior to visit.    Review of Systems  Psychiatric/Behavioral: Positive for decreased concentration. Negative for self-injury, sleep disturbance and suicidal ideas. The patient is not nervous/anxious.     Last CBC Lab Results  Component Value Date   WBC 7.5 08/07/2018   HGB 15.9 08/07/2018   HCT 47.7 08/07/2018   MCV 89 08/07/2018   MCH 29.8 08/07/2018   RDW 13.0 08/07/2018   PLT 282 08/07/2018   Last metabolic panel Lab Results  Component Value Date   GLUCOSE 85 08/10/2019   NA 141 08/10/2019   K 4.4 08/10/2019   CL 101 08/10/2019   CO2 26 08/10/2019   BUN 8 08/10/2019   CREATININE 1.02 08/10/2019   GFRNONAA 102 08/10/2019   GFRAA 118 08/10/2019   CALCIUM 9.5 08/10/2019   PROT 6.4 08/10/2019   ALBUMIN 4.8 08/10/2019   LABGLOB 1.6 08/10/2019   AGRATIO 3.0 (H) 08/10/2019   BILITOT 0.5 08/10/2019   ALKPHOS 64 08/10/2019   AST 17 08/10/2019   ALT 11 08/10/2019   Last thyroid functions Lab Results  Component Value Date   TSH 1.810 08/10/2019  Objective    BP 114/68 (BP Location: Right Arm, Patient Position: Sitting, Cuff Size: Large)   Pulse 75   Temp 98.2 F (36.8 C) (Oral)   Wt 165 lb (74.8 kg)   BMI 24.37 kg/m  Wt Readings from Last 3 Encounters:  02/15/20 165 lb (74.8 kg)  08/10/19 174 lb (78.9 kg)  08/07/18 180 lb 6.4 oz (81.8 kg)     Physical Exam Constitutional:      General: He is not in acute distress.    Appearance: Normal appearance. He is normal weight. He is not ill-appearing, toxic-appearing or diaphoretic.  HENT:     Head: Normocephalic and atraumatic.     Right Ear: External ear normal.     Left Ear: External ear normal.    Cardiovascular:     Rate and Rhythm: Normal rate and regular rhythm.     Heart sounds: Normal heart sounds. No murmur heard.   Pulmonary:     Effort: Pulmonary effort is normal. No respiratory distress.     Breath sounds: Normal breath sounds. No wheezing.  Musculoskeletal:     Cervical back: Normal range of motion.  Neurological:     General: No focal deficit present.     Mental Status: He is alert and oriented to person, place, and time. Mental status is at baseline.  Psychiatric:        Attention and Perception: Attention normal.        Mood and Affect: Mood and affect normal. Mood is not anxious or depressed. Affect is not flat or tearful.        Speech: Speech normal.        Behavior: Behavior normal. Behavior is cooperative.        Thought Content: Thought content normal. Thought content does not include homicidal or suicidal ideation. Thought content does not include homicidal or suicidal plan.        Judgment: Judgment normal.    No results found for any visits on 02/15/20.  Assessment & Plan     Problem List Items Addressed This Visit      Other   Current mild episode of major depressive disorder without prior episode (HCC) - Primary    Much improved from previous visit In partial remission No SI/HI Not on any treatment F/U in 6 months         Return in about 6 months (around 08/14/2020) for CPE.      Higinio Roger, MS3

## 2020-02-18 NOTE — Addendum Note (Signed)
Addended by: Kavin Leech E on: 02/18/2020 11:10 AM   Modules accepted: Orders

## 2020-08-05 NOTE — Patient Instructions (Signed)
Preventive Care 21-26 Years Old, Male Preventive care refers to lifestyle choices and visits with your health care provider that can promote health and wellness. This includes:  A yearly physical exam. This is also called an annual wellness visit.  Regular dental and eye exams.  Immunizations.  Screening for certain conditions.  Healthy lifestyle choices, such as: ? Eating a healthy diet. ? Getting regular exercise. ? Not using drugs or products that contain nicotine and tobacco. ? Limiting alcohol use. What can I expect for my preventive care visit? Physical exam Your health care provider may check your:  Height and weight. These may be used to calculate your BMI (body mass index). BMI is a measurement that tells if you are at a healthy weight.  Heart rate and blood pressure.  Body temperature.  Skin for abnormal spots. Counseling Your health care provider may ask you questions about your:  Past medical problems.  Family's medical history.  Alcohol, tobacco, and drug use.  Emotional well-being.  Home life and relationship well-being.  Sexual activity.  Diet, exercise, and sleep habits.  Work and work environment.  Access to firearms. What immunizations do I need? Vaccines are usually given at various ages, according to a schedule. Your health care provider will recommend vaccines for you based on your age, medical history, and lifestyle or other factors, such as travel or where you work.   What tests do I need? Blood tests  Lipid and cholesterol levels. These may be checked every 5 years starting at age 20.  Hepatitis C test.  Hepatitis B test. Screening  Diabetes screening. This is done by checking your blood sugar (glucose) after you have not eaten for a while (fasting).  Genital exam to check for testicular cancer or hernias.  STD (sexually transmitted disease) testing, if you are at risk. Talk with your health care provider about your test results,  treatment options, and if necessary, the need for more tests.   Follow these instructions at home: Eating and drinking  Eat a healthy diet that includes fresh fruits and vegetables, whole grains, lean protein, and low-fat dairy products.  Drink enough fluid to keep your urine pale yellow.  Take vitamin and mineral supplements as recommended by your health care provider.  Do not drink alcohol if your health care provider tells you not to drink.  If you drink alcohol: ? Limit how much you have to 0-2 drinks a day. ? Be aware of how much alcohol is in your drink. In the [place] one drink equals one 12 oz bottle of beer (355 mL), one 5 oz glass of wine (148 mL), or one 1 oz glass of hard liquor (44 mL).   Lifestyle  Take daily care of your teeth and gums. Brush your teeth every morning and night with fluoride toothpaste. Floss one time each day.  Stay active. Exercise for at least 30 minutes 5 or more days each week.  Do not use any products that contain nicotine or tobacco, such as cigarettes, e-cigarettes, and chewing tobacco. If you need help quitting, ask your health care provider.  Do not use drugs.  If you are sexually active, practice safe sex. Use a condom or other form of protection to prevent STIs (sexually transmitted infections).  Find healthy ways to cope with stress, such as: ? Meditation, yoga, or listening to music. ? Journaling. ? Talking to a trusted person. ? Spending time with friends and family. Safety  Always wear your seat belt while driving   or riding in a vehicle.  Do not drive: ? If you have been drinking alcohol. Do not ride with someone who has been drinking. ? When you are tired or distracted. ? While texting.  Wear a helmet and other protective equipment during sports activities.  If you have firearms in your house, make sure you follow all gun safety procedures.  Seek help if you have been physically or sexually abused. What's next?  Go to your  health care provider once a year for an annual wellness visit.  Ask your health care provider how often you should have your eyes and teeth checked.  Stay up to date on all vaccines. This information is not intended to replace advice given to you by your health care provider. Make sure you discuss any questions you have with your health care provider. Document Revised: 02/04/2019 Document Reviewed: 05/15/2018 Elsevier Patient Education  2021 [employer] Inc.  

## 2020-08-05 NOTE — Progress Notes (Signed)
Complete physical exam   Patient: Nicholas Villanueva   DOB: 1996/04/22   24 y.o. Male  MRN: [id number] Visit Date: 08/08/2020  Today's healthcare provider: Lavon Paganini, MD   Chief Complaint  Patient presents with  . Annual Exam   Subjective    Nicholas Villanueva is a 25 y.o. male who presents today for a complete physical exam.  He reports consuming a general diet. The patient does not participate in regular exercise at present. He generally feels well. He reports sleeping well. He does have additional problems to discuss today.  HPI  Patient would like to discuss getting medication for ADHD. Patient reports he has off medication about 4-5 years. Patient reports that he will be going back to school and possibly getting a different job.  Past Medical History:  Diagnosis Date  . ADHD (attention deficit hyperactivity disorder)   . Asthma    Past Surgical History:  Procedure Laterality Date  . PILONIDAL CYST EXCISION N/A 12/12/2015   Procedure: CYST EXCISION PILONIDAL EXTENSIVE;  Surgeon: Robert Bellow, MD;  Location: [facility] ORS;  Service: General;  Laterality: N/A;  . skin biopsies  8 years ago   neck  . WISDOM TOOTH EXTRACTION     Social History   Socioeconomic History  . Marital status: Single    Spouse name: Not on file  . Number of children: 0  . Years of education: Not on file  . Highest education level: Some college, no degree  Occupational History  . Occupation: Psychologist, educational  Tobacco Use  . Smoking status: Never Smoker  . Smokeless tobacco: Never Used  Vaping Use  . Vaping Use: Never used  Substance and Sexual Activity  . Alcohol use: No    Alcohol/week: 0.0 standard drinks  . Drug use: No  . Sexual activity: Never  Other Topics Concern  . Not on file  Social History Narrative  . Not on file   Social Determinants of Health   Financial Resource Strain: Not on file  Food Insecurity: Not on file  Transportation Needs: Not on file  Physical  Activity: Not on file  Stress: Not on file  Social Connections: Not on file  Intimate Partner Violence: Not on file   Family Status  Relation Name Status  . Mother  Alive  . Father  Alive  . Brother  Alive  . MGM  Alive  . MGF  Alive  . PGM  Alive  . PGF  Alive  . Neg Hx  (Not Specified)   Family History  Problem Relation Age of Onset  . Breast cancer Mother        in remission  . Arthritis Mother   . Healthy Father   . Healthy Brother   . Breast cancer Maternal Grandmother   . Rheum arthritis Maternal Grandmother   . Diabetes Maternal Grandfather   . Hypertension Maternal Grandfather   . Skin cancer Maternal Grandfather   . Diabetes Paternal Grandmother   . Hypertension Paternal Grandmother   . Hypertension Paternal Grandfather   . Skin cancer Paternal Grandfather   . Colon cancer Neg Hx   . Prostate cancer Neg Hx    Allergies  Allergen Reactions  . Other Anaphylaxis    Peanuts    Patient Care Team: Virginia Crews, MD as PCP - General (Family Medicine) Dasher, Rayvon Char, MD (Dermatology) Bary Castilla Forest Gleason, MD (General Surgery)   Medications: Outpatient Medications Prior to Visit  Medication Sig  .  albuterol (PROVENTIL HFA;VENTOLIN HFA) 108 (90 Base) MCG/ACT inhaler Inhale 2 puffs into the lungs every 6 (six) hours as needed for wheezing or shortness of breath.  . EPINEPHrine 0.3 mg/0.3 mL IJ SOAJ injection INJECT 0.3 MLS (0.3 MG TOTAL) INTO THE MUSCLE ONCE FOR 1 DOSE.   No facility-administered medications prior to visit.    Review of Systems  Constitutional: Negative.   HENT: Negative.   Eyes: Negative.   Respiratory: Negative.   Cardiovascular: Negative.   Gastrointestinal: Negative.   Endocrine: Negative.   Genitourinary: Negative.   Musculoskeletal: Negative.   Skin: Negative.   Allergic/Immunologic: Positive for food allergies.  Neurological: Negative.   Hematological: Negative.   Psychiatric/Behavioral: Positive for decreased  concentration. The patient is nervous/anxious and is hyperactive.     Last CBC Lab Results  Component Value Date   WBC 7.5 08/07/2018   HGB 15.9 08/07/2018   HCT 47.7 08/07/2018   MCV 89 08/07/2018   MCH 29.8 08/07/2018   RDW 13.0 08/07/2018   PLT 282 91/79/1505   Last metabolic panel Lab Results  Component Value Date   GLUCOSE 85 08/10/2019   NA 141 08/10/2019   K 4.4 08/10/2019   CL 101 08/10/2019   CO2 26 08/10/2019   BUN 8 08/10/2019   CREATININE 1.02 08/10/2019   GFRNONAA 102 08/10/2019   GFRAA 118 08/10/2019   CALCIUM 9.5 08/10/2019   PROT 6.4 08/10/2019   ALBUMIN 4.8 08/10/2019   LABGLOB 1.6 08/10/2019   AGRATIO 3.0 (H) 08/10/2019   BILITOT 0.5 08/10/2019   ALKPHOS 64 08/10/2019   AST 17 08/10/2019   ALT 11 08/10/2019   Last lipids Lab Results  Component Value Date   CHOL 178 08/10/2019   HDL 77 08/10/2019   LDLCALC 88 08/10/2019   TRIG 72 08/10/2019   CHOLHDL 2.3 08/10/2019   Last hemoglobin A1c No results found for: HGBA1C Last thyroid functions Lab Results  Component Value Date   TSH 1.810 08/10/2019      Objective    BP 122/80 (BP Location: Left Arm, Patient Position: Sitting, Cuff Size: Large)   Pulse 71   Temp 98.3 F (36.8 C) (Oral)   Resp 16   Ht '5\' 9"'  (1.753 m)   Wt 182 lb 14.4 oz (83 kg)   SpO2 98%   BMI 27.01 kg/m  BP Readings from Last 3 Encounters:  08/08/20 122/80  02/15/20 114/68  08/10/19 105/71   Wt Readings from Last 3 Encounters:  08/08/20 182 lb 14.4 oz (83 kg)  02/15/20 165 lb (74.8 kg)  08/10/19 174 lb (78.9 kg)      Physical Exam Vitals reviewed.  Constitutional:      General: He is not in acute distress.    Appearance: Normal appearance. He is well-developed. He is not diaphoretic.  HENT:     Head: Normocephalic and atraumatic.     Right Ear: Tympanic membrane, ear canal and external ear normal.     Left Ear: Tympanic membrane, ear canal and external ear normal.     Nose: Nose normal.      Mouth/Throat:     Mouth: Mucous membranes are moist.     Pharynx: Oropharynx is clear. No oropharyngeal exudate.  Eyes:     General: No scleral icterus.    Conjunctiva/sclera: Conjunctivae normal.     Pupils: Pupils are equal, round, and reactive to light.  Neck:     Thyroid: No thyromegaly.  Cardiovascular:     Rate and Rhythm: Normal  rate and regular rhythm.     Pulses: Normal pulses.     Heart sounds: Normal heart sounds. No murmur heard.   Pulmonary:     Effort: Pulmonary effort is normal. No respiratory distress.     Breath sounds: Normal breath sounds. No wheezing or rales.  Abdominal:     General: There is no distension.     Palpations: Abdomen is soft.     Tenderness: There is no abdominal tenderness.  Musculoskeletal:        General: No deformity.     Cervical back: Neck supple.     Right lower leg: No edema.     Left lower leg: No edema.  Lymphadenopathy:     Cervical: No cervical adenopathy.  Skin:    General: Skin is warm and dry.     Findings: No rash.  Neurological:     Mental Status: He is alert and oriented to person, place, and time. Mental status is at baseline.     Sensory: No sensory deficit.     Motor: No weakness.     Gait: Gait normal.  Psychiatric:        Mood and Affect: Mood normal.        Behavior: Behavior normal.        Thought Content: Thought content normal.       Last depression screening scores PHQ 2/9 Scores 08/08/2020 02/15/2020 08/10/2019  PHQ - 2 Score '2 2 4  ' PHQ- 9 Score '6 6 11   ' Last fall risk screening Fall Risk  08/08/2020  Falls in the past year? 0  Number falls in past yr: 0  Injury with Fall? 0  Risk for fall due to : No Fall Risks  Follow up Falls evaluation completed   Last Audit-C alcohol use screening Alcohol Use Disorder Test (AUDIT) 08/08/2020  1. How often do you have a drink containing alcohol? 0  2. How many drinks containing alcohol do you have on a typical day when you are drinking? 0  3. How often do you have  six or more drinks on one occasion? 0  AUDIT-C Score 0  Alcohol Brief Interventions/Follow-up AUDIT Score <7 follow-up not indicated   A score of 3 or more in women, and 4 or more in men indicates increased risk for alcohol abuse, EXCEPT if all of the points are from question 1   No results found for any visits on 08/08/20.  Assessment & Plan    Routine Health Maintenance and Physical Exam  Exercise Activities and Dietary recommendations Goals   None     Immunization History  Administered Date(s) Administered  . DTaP 05/03/1995, 07/05/1995, 09/06/1995, 08/14/1996, 01/13/2001  . HPV Quadrivalent 01/23/2011, 03/23/2011, 01/25/2012  . Hepatitis A 01/30/2013  . Hepatitis A, Adult 08/05/2017  . Hepatitis B 01/07/1995, 05/03/1995, 12/05/1995  . HiB (PRP-OMP) 05/03/1995, 07/05/1995, 09/06/1995, 08/14/1996  . IPV 05/03/1995, 07/05/1995, 09/06/1995, 01/13/2001  . Influenza,inj,Quad PF,6+ Mos 08/07/2018, 02/15/2020  . MMR 03/06/1996, 01/13/2001  . Meningococcal Mcv4o 01/20/2013  . Td 08/05/2017  . Tdap 11/22/2006  . Varicella 03/06/1996, 01/23/2011    Health Maintenance  Topic Date Due  . Hepatitis C Screening  Never done  . TETANUS/TDAP  08/06/2027  . INFLUENZA VACCINE  Completed  . HPV VACCINES  Completed  . HIV Screening  Completed    Discussed health benefits of physical activity, and encouraged him to engage in regular exercise appropriate for his age and condition.  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     I, Lavon Paganini, MD, have reviewed all documentation for this visit. The documentation on 08/08/20 for the exam, diagnosis, procedures, and orders are all accurate and complete.   , Dionne Bucy, MD, MPH [facility]

## 2020-08-08 ENCOUNTER — Encounter: Payer: Self-pay | Admitting: Family Medicine

## 2020-08-08 ENCOUNTER — Other Ambulatory Visit: Payer: Self-pay

## 2020-08-08 ENCOUNTER — Ambulatory Visit (INDEPENDENT_AMBULATORY_CARE_PROVIDER_SITE_OTHER): Payer: Commercial Managed Care - PPO | Admitting: Family Medicine

## 2020-08-08 VITALS — BP 122/80 | HR 71 | Temp 98.3°F | Resp 16 | Ht 69.0 in | Wt 182.9 lb

## 2020-08-08 DIAGNOSIS — F909 Attention-deficit hyperactivity disorder, unspecified type: Secondary | ICD-10-CM | POA: Diagnosis not present

## 2020-08-08 DIAGNOSIS — E663 Overweight: Secondary | ICD-10-CM

## 2020-08-08 DIAGNOSIS — J45990 Exercise induced bronchospasm: Secondary | ICD-10-CM

## 2020-08-08 DIAGNOSIS — Z0000 Encounter for general adult medical examination without abnormal findings: Secondary | ICD-10-CM | POA: Diagnosis not present

## 2020-08-08 MED ORDER — METHYLPHENIDATE HCL ER (OSM) 27 MG PO TBCR: 27.0000 mg | EXTENDED_RELEASE_TABLET | ORAL | 0 refills | Status: DC

## 2020-08-08 NOTE — Assessment & Plan Note (Signed)
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

## 2020-08-08 NOTE — Assessment & Plan Note (Signed)
Discussed importance of healthy weight management Discussed diet and exercise  

## 2020-08-08 NOTE — Assessment & Plan Note (Signed)
Well controlled Continue albuterol prn

## 2020-12-08 ENCOUNTER — Ambulatory Visit: Payer: Self-pay | Admitting: Family Medicine

## 2021-02-07 ENCOUNTER — Ambulatory Visit: Payer: Commercial Managed Care - PPO | Admitting: Family Medicine

## 2021-02-07 ENCOUNTER — Encounter: Payer: Self-pay | Admitting: Family Medicine

## 2021-02-07 ENCOUNTER — Other Ambulatory Visit: Payer: Self-pay

## 2021-02-07 VITALS — BP 109/75 | HR 69 | Temp 98.2°F | Ht 69.0 in | Wt 194.0 lb

## 2021-02-07 DIAGNOSIS — F909 Attention-deficit hyperactivity disorder, unspecified type: Secondary | ICD-10-CM | POA: Diagnosis not present

## 2021-02-07 DIAGNOSIS — Z23 Encounter for immunization: Secondary | ICD-10-CM

## 2021-02-07 MED ORDER — METHYLPHENIDATE HCL ER (OSM) 27 MG PO TBCR: 27.0000 mg | EXTENDED_RELEASE_TABLET | ORAL | 0 refills | Status: DC

## 2021-02-07 NOTE — Progress Notes (Signed)
Established patient visit   Patient: Nicholas Villanueva   DOB: July 09, 1995   25 y.o. Male  MRN: [id number] Visit Date: 02/07/2021  Today's healthcare provider: Shirlee Latch, MD   Chief Complaint  Patient presents with   Follow-up   ADHD   Subjective  --------------------------------------------------------------------------------------------------------------------  HPI   Flu Vaccine-02/07/21  ADHD From his last visit on 08/08/20. He has been waiting for approval from insurance in order to pick up his medication from [facility]. He has not been able to take his medication due to this issue. He hasn't returned to school yet but he had concerns about returning without being on his medication.     Medications: Outpatient Medications Prior to Visit  Medication Sig   albuterol (PROVENTIL HFA;VENTOLIN HFA) 108 (90 Base) MCG/ACT inhaler Inhale 2 puffs into the lungs every 6 (six) hours as needed for wheezing or shortness of breath.   EPINEPHrine 0.3 mg/0.3 mL IJ SOAJ injection INJECT 0.3 MLS (0.3 MG TOTAL) INTO THE MUSCLE ONCE FOR 1 DOSE.   [DISCONTINUED] methylphenidate (CONCERTA) 27 MG PO CR tablet Take 1 tablet (27 mg total) by mouth every morning. (Patient not taking: Reported on 02/07/2021)   [DISCONTINUED] methylphenidate (CONCERTA) 27 MG PO CR tablet Take 1 tablet (27 mg total) by mouth every morning. (Patient not taking: Reported on 02/07/2021)   [DISCONTINUED] methylphenidate (CONCERTA) 27 MG PO CR tablet Take 1 tablet (27 mg total) by mouth every morning. (Patient not taking: Reported on 02/07/2021)   [DISCONTINUED] methylphenidate (CONCERTA) 27 MG PO CR tablet Take 1 tablet (27 mg total) by mouth every morning. (Patient not taking: Reported on 02/07/2021)   No facility-administered medications prior to visit.    Review of Systems  Constitutional:  Negative for chills, fatigue and fever.  HENT:  Negative for ear pain, sinus pressure, sinus pain and sore throat.   Eyes:  Negative  for pain and visual disturbance.  Respiratory:  Negative for cough, chest tightness, shortness of breath and wheezing.   Cardiovascular:  Negative for chest pain, palpitations and leg swelling.  Gastrointestinal:  Negative for abdominal pain, blood in stool, diarrhea, nausea and vomiting.  Genitourinary:  Negative for flank pain, frequency and urgency.  Musculoskeletal:  Negative for back pain, myalgias and neck pain.  Neurological:  Negative for dizziness, weakness, light-headedness, numbness and headaches.   Last CBC Lab Results  Component Value Date   WBC 7.5 08/07/2018   HGB 15.9 08/07/2018   HCT 47.7 08/07/2018   MCV 89 08/07/2018   MCH 29.8 08/07/2018   RDW 13.0 08/07/2018   PLT 282 08/07/2018   Last metabolic panel Lab Results  Component Value Date   GLUCOSE 85 08/10/2019   NA 141 08/10/2019   K 4.4 08/10/2019   CL 101 08/10/2019   CO2 26 08/10/2019   BUN 8 08/10/2019   CREATININE 1.02 08/10/2019   GFRNONAA 102 08/10/2019   GFRAA 118 08/10/2019   CALCIUM 9.5 08/10/2019   PROT 6.4 08/10/2019   ALBUMIN 4.8 08/10/2019   LABGLOB 1.6 08/10/2019   AGRATIO 3.0 (H) 08/10/2019   BILITOT 0.5 08/10/2019   ALKPHOS 64 08/10/2019   AST 17 08/10/2019   ALT 11 08/10/2019   Last lipids Lab Results  Component Value Date   CHOL 178 08/10/2019   HDL 77 08/10/2019   LDLCALC 88 08/10/2019   TRIG 72 08/10/2019   CHOLHDL 2.3 08/10/2019   Last thyroid functions Lab Results  Component Value Date   TSH 1.810 08/10/2019  Objective  -------------------------------------------------------------------------------------------------------------------- BP 109/75 (BP Location: Right Arm, Patient Position: Sitting, Cuff Size: Normal)   Pulse 69   Temp 98.2 F (36.8 C) (Oral)   Ht 5\' 9"  (1.753 m)   Wt 194 lb (88 kg)   SpO2 99%   BMI 28.65 kg/m  BP Readings from Last 3 Encounters:  02/07/21 109/75  08/08/20 122/80  02/15/20 114/68   Wt Readings from Last 3 Encounters:   02/07/21 194 lb (88 kg)  08/08/20 182 lb 14.4 oz (83 kg)  02/15/20 165 lb (74.8 kg)      Physical Exam Vitals reviewed.  Constitutional:      General: He is not in acute distress.    Appearance: Normal appearance. He is not diaphoretic.  HENT:     Head: Normocephalic and atraumatic.  Eyes:     General: No scleral icterus.    Conjunctiva/sclera: Conjunctivae normal.  Cardiovascular:     Rate and Rhythm: Normal rate and regular rhythm.     Pulses: Normal pulses.     Heart sounds: Normal heart sounds. No murmur heard. Pulmonary:     Effort: Pulmonary effort is normal. No respiratory distress.     Breath sounds: Normal breath sounds. No wheezing or rhonchi.  Abdominal:     General: There is no distension.     Palpations: Abdomen is soft.     Tenderness: There is no abdominal tenderness.  Musculoskeletal:     Cervical back: Neck supple.     Right lower leg: No edema.     Left lower leg: No edema.  Lymphadenopathy:     Cervical: No cervical adenopathy.  Skin:    General: Skin is warm and dry.     Capillary Refill: Capillary refill takes less than 2 seconds.     Findings: No rash.  Neurological:     Mental Status: He is alert and oriented to person, place, and time.     Cranial Nerves: No cranial nerve deficit.  Psychiatric:        Mood and Affect: Mood normal.        Behavior: Behavior normal.      No results found for any visits on 02/07/21.  Assessment & Plan  ---------------------------------------------------------------------------------------------------------------------- Problem List Items Addressed This Visit       Other   ADHD - Primary    Uncontrolled Has not been able to get his Concerta from the pharmacy due to pending PA, but we seem to have not receivedthis Will check with pharmacy and get this working      Other Visit Diagnoses     Flu vaccine need       Relevant Orders   Flu Vaccine QUAD 6+ mos PF IM (Fluarix Quad PF) (Completed)         Return in about 6 months (around 08/07/2021) for CPE, as scheduled.      I,Essence Turner,acting as a 10/07/2021 for Neurosurgeon, MD.,have documented all relevant documentation on the behalf of Shirlee Latch, MD,as directed by  Shirlee Latch, MD while in the presence of Shirlee Latch, MD.  I, Shirlee Latch, MD, have reviewed all documentation for this visit. The documentation on 02/08/21 for the exam, diagnosis, procedures, and orders are all accurate and complete.   Dalene Robards, 04/10/21, MD, MPH [facility]

## 2021-02-07 NOTE — Assessment & Plan Note (Signed)
Uncontrolled Has not been able to get his Concerta from the pharmacy due to pending PA, but we seem to have not receivedthis Will check with pharmacy and get this working

## 2021-07-19 DIAGNOSIS — D2261 Melanocytic nevi of right upper limb, including shoulder: Secondary | ICD-10-CM | POA: Diagnosis not present

## 2021-07-19 DIAGNOSIS — D2262 Melanocytic nevi of left upper limb, including shoulder: Secondary | ICD-10-CM | POA: Diagnosis not present

## 2021-07-19 DIAGNOSIS — D2272 Melanocytic nevi of left lower limb, including hip: Secondary | ICD-10-CM | POA: Diagnosis not present

## 2021-07-19 DIAGNOSIS — D225 Melanocytic nevi of trunk: Secondary | ICD-10-CM | POA: Diagnosis not present

## 2021-08-09 NOTE — Progress Notes (Signed)
I,Sulibeya S Dimas,acting as a Education administrator for Lavon Paganini, MD.,have documented all relevant documentation on the behalf of Lavon Paganini, MD,as directed by  Lavon Paganini, MD while in the presence of Lavon Paganini, MD.   Complete physical exam   Patient: Nicholas Villanueva   DOB: 12-31-1995   26 y.o. Male  MRN: [id number] Visit Date: 08/10/2021  Today's healthcare provider: Lavon Paganini, MD   Chief Complaint  Patient presents with   Annual Exam   Subjective    Nicholas Villanueva is a 26 y.o. male who presents today for a complete physical exam.  He reports consuming a general diet. The patient does not participate in regular exercise at present. He generally feels well. He reports sleeping well. He does not have additional problems to discuss today.  HPI    Past Medical History:  Diagnosis Date   ADHD (attention deficit hyperactivity disorder)    Asthma    Past Surgical History:  Procedure Laterality Date   PILONIDAL CYST EXCISION N/A 12/12/2015   Procedure: CYST EXCISION PILONIDAL EXTENSIVE;  Surgeon: Robert Bellow, MD;  Location: [facility] ORS;  Service: General;  Laterality: N/A;   skin biopsies  8 years ago   neck   WISDOM TOOTH EXTRACTION     Social History   Socioeconomic History   Marital status: Single    Spouse name: Not on file   Number of children: 0   Years of education: Not on file   Highest education level: Some college, no degree  Occupational History   Occupation: Psychologist, educational  Tobacco Use   Smoking status: Never   Smokeless tobacco: Never  Vaping Use   Vaping Use: Never used  Substance and Sexual Activity   Alcohol use: No    Alcohol/week: 0.0 standard drinks   Drug use: No   Sexual activity: Never  Other Topics Concern   Not on file  Social History Narrative   Not on file   Social Determinants of Health   Financial Resource Strain: Not on file  Food Insecurity: Not on file  Transportation Needs: Not on file  Physical  Activity: Not on file  Stress: Not on file  Social Connections: Not on file  Intimate Partner Violence: Not on file   Family Status  Relation Name Status   Mother  Alive   Father  Alive   Brother  Alive   MGM  Alive   MGF  Alive   PGM  Alive   PGF  Alive   Neg Hx  (Not Specified)   Family History  Problem Relation Age of Onset   Breast cancer Mother        in remission   Arthritis Mother    Healthy Father    Healthy Brother    Breast cancer Maternal Grandmother    Rheum arthritis Maternal Grandmother    Diabetes Maternal Grandfather    Hypertension Maternal Grandfather    Skin cancer Maternal Grandfather    Diabetes Paternal Grandmother    Hypertension Paternal Grandmother    Hypertension Paternal Grandfather    Skin cancer Paternal Grandfather    Colon cancer Neg Hx    Prostate cancer Neg Hx    Allergies  Allergen Reactions   Other Anaphylaxis    Peanuts    Patient Care Team: Jalayna Josten, Dionne Bucy, MD as PCP - General (Family Medicine) Dasher, Rayvon Char, MD (Dermatology) Bary Castilla, Forest Gleason, MD (General Surgery)   Medications: Outpatient Medications Prior to Visit  Medication Sig  albuterol (PROVENTIL HFA;VENTOLIN HFA) 108 (90 Base) MCG/ACT inhaler Inhale 2 puffs into the lungs every 6 (six) hours as needed for wheezing or shortness of breath.   EPINEPHrine 0.3 mg/0.3 mL IJ SOAJ injection INJECT 0.3 MLS (0.3 MG TOTAL) INTO THE MUSCLE ONCE FOR 1 DOSE.   [DISCONTINUED] methylphenidate (CONCERTA) 27 MG PO CR tablet Take 1 tablet (27 mg total) by mouth every morning.   [DISCONTINUED] methylphenidate (CONCERTA) 27 MG PO CR tablet Take 1 tablet (27 mg total) by mouth every morning.   [DISCONTINUED] methylphenidate (CONCERTA) 27 MG PO CR tablet Take 1 tablet (27 mg total) by mouth every morning.   [DISCONTINUED] methylphenidate (CONCERTA) 27 MG PO CR tablet Take 1 tablet (27 mg total) by mouth every morning.   No facility-administered medications prior to visit.     Review of Systems  Allergic/Immunologic: Positive for food allergies.  Psychiatric/Behavioral:  Positive for decreased concentration. The patient is nervous/anxious and is hyperactive.   All other systems reviewed and are negative.  Last CBC Lab Results  Component Value Date   WBC 7.5 08/07/2018   HGB 15.9 08/07/2018   HCT 47.7 08/07/2018   MCV 89 08/07/2018   MCH 29.8 08/07/2018   RDW 13.0 08/07/2018   PLT 282 19/75/8832   Last metabolic panel Lab Results  Component Value Date   GLUCOSE 85 08/10/2019   NA 141 08/10/2019   K 4.4 08/10/2019   CL 101 08/10/2019   CO2 26 08/10/2019   BUN 8 08/10/2019   CREATININE 1.02 08/10/2019   GFRNONAA 102 08/10/2019   CALCIUM 9.5 08/10/2019   PROT 6.4 08/10/2019   ALBUMIN 4.8 08/10/2019   LABGLOB 1.6 08/10/2019   AGRATIO 3.0 (H) 08/10/2019   BILITOT 0.5 08/10/2019   ALKPHOS 64 08/10/2019   AST 17 08/10/2019   ALT 11 08/10/2019   Last lipids Lab Results  Component Value Date   CHOL 178 08/10/2019   HDL 77 08/10/2019   LDLCALC 88 08/10/2019   TRIG 72 08/10/2019   CHOLHDL 2.3 08/10/2019       Objective    BP 135/79 (BP Location: Left Arm, Patient Position: Sitting, Cuff Size: Large)    Pulse 82    Temp 97.9 F (36.6 C) (Temporal)    Resp 16    Ht '5\' 9"'  (1.753 m)    Wt 193 lb 8 oz (87.8 kg)    BMI 28.57 kg/m  BP Readings from Last 3 Encounters:  08/10/21 135/79  02/07/21 109/75  08/08/20 122/80   Wt Readings from Last 3 Encounters:  08/10/21 193 lb 8 oz (87.8 kg)  02/07/21 194 lb (88 kg)  08/08/20 182 lb 14.4 oz (83 kg)       Physical Exam Vitals reviewed.  Constitutional:      General: He is not in acute distress.    Appearance: Normal appearance. He is well-developed. He is not diaphoretic.  HENT:     Head: Normocephalic and atraumatic.     Right Ear: Tympanic membrane, ear canal and external ear normal.     Left Ear: Tympanic membrane, ear canal and external ear normal.     Nose: Nose normal.      Mouth/Throat:     Mouth: Mucous membranes are moist.     Pharynx: Oropharynx is clear. No oropharyngeal exudate.  Eyes:     General: No scleral icterus.    Conjunctiva/sclera: Conjunctivae normal.     Pupils: Pupils are equal, round, and reactive to light.  Neck:  Thyroid: No thyromegaly.  Cardiovascular:     Rate and Rhythm: Normal rate and regular rhythm.     Pulses: Normal pulses.     Heart sounds: Normal heart sounds. No murmur heard. Pulmonary:     Effort: Pulmonary effort is normal. No respiratory distress.     Breath sounds: Normal breath sounds. No wheezing or rales.  Abdominal:     General: There is no distension.     Palpations: Abdomen is soft.     Tenderness: There is no abdominal tenderness.  Musculoskeletal:        General: No deformity.     Cervical back: Neck supple.     Right lower leg: No edema.     Left lower leg: No edema.  Lymphadenopathy:     Cervical: No cervical adenopathy.  Skin:    General: Skin is warm and dry.     Findings: No rash.  Neurological:     Mental Status: He is alert and oriented to person, place, and time. Mental status is at baseline.     Gait: Gait normal.  Psychiatric:        Mood and Affect: Mood normal.        Behavior: Behavior normal.        Thought Content: Thought content normal.      Last depression screening scores PHQ 2/9 Scores 08/10/2021 02/07/2021 08/08/2020  PHQ - 2 Score '1 2 2  ' PHQ- 9 Score '7 6 6   ' Last fall risk screening Fall Risk  08/10/2021  Falls in the past year? 0  Number falls in past yr: 0  Injury with Fall? 0  Risk for fall due to : No Fall Risks  Follow up Falls evaluation completed   Last Audit-C alcohol use screening Alcohol Use Disorder Test (AUDIT) 08/10/2021  1. How often do you have a drink containing alcohol? 0  2. How many drinks containing alcohol do you have on a typical day when you are drinking? 0  3. How often do you have six or more drinks on one occasion? 0  AUDIT-C Score 0  Alcohol  Brief Interventions/Follow-up -   A score of 3 or more in women, and 4 or more in men indicates increased risk for alcohol abuse, EXCEPT if all of the points are from question 1   No results found for any visits on 08/10/21.  Assessment & Plan    Routine Health Maintenance and Physical Exam  Exercise Activities and Dietary recommendations  Goals   None     Immunization History  Administered Date(s) Administered   DTaP 05/03/1995, 07/05/1995, 09/06/1995, 08/14/1996, 01/13/2001   HPV Quadrivalent 01/23/2011, 03/23/2011, 01/25/2012   Hepatitis A 01/30/2013   Hepatitis A, Adult 08/05/2017   Hepatitis B 12/24/94, 05/03/1995, 12/05/1995   HiB (PRP-OMP) 05/03/1995, 07/05/1995, 09/06/1995, 08/14/1996   IPV 05/03/1995, 07/05/1995, 09/06/1995, 01/13/2001   Influenza,inj,Quad PF,6+ Mos 08/07/2018, 02/15/2020, 02/07/2021   MMR 03/06/1996, 01/13/2001   Meningococcal Mcv4o 01/20/2013   PFIZER(Purple Top)SARS-COV-2 Vaccination 08/12/2019, 05/25/2020, 08/01/2020   Td 08/05/2017   Tdap 11/22/2006   Varicella 03/06/1996, 01/23/2011    Health Maintenance  Topic Date Due   Hepatitis C Screening  Never done   COVID-19 Vaccine (4 - Booster for Pfizer series) 09/26/2020   TETANUS/TDAP  08/06/2027   INFLUENZA VACCINE  Completed   HPV VACCINES  Completed   HIV Screening  Completed    Discussed health benefits of physical activity, and encouraged him to engage in regular exercise appropriate for  his age and condition.  Problem List Items Addressed This Visit       Other   ADHD    Previously well controlled on concerta but difficulty obtaining due to insurance  will renew today      Overweight    Discussed importance of healthy weight management Discussed diet and exercise       Other Visit Diagnoses     Encounter for annual health examination    -  Primary       Needs Hep C screen with next labs Joint decision for no screening labs this year as last were normal   Return  in about 6 months (around 02/10/2022) for ADD f/u, virtual ok.     I, Lavon Paganini, MD, have reviewed all documentation for this visit. The documentation on 08/10/21 for the exam, diagnosis, procedures, and orders are all accurate and complete.   Dhana Totton, Dionne Bucy, MD, MPH [facility]

## 2021-08-10 ENCOUNTER — Encounter: Payer: Self-pay | Admitting: Family Medicine

## 2021-08-10 ENCOUNTER — Other Ambulatory Visit: Payer: Self-pay

## 2021-08-10 ENCOUNTER — Ambulatory Visit (INDEPENDENT_AMBULATORY_CARE_PROVIDER_SITE_OTHER): Payer: BC Managed Care – PPO | Admitting: Family Medicine

## 2021-08-10 VITALS — BP 135/79 | HR 82 | Temp 97.9°F | Resp 16 | Ht 69.0 in | Wt 193.5 lb

## 2021-08-10 DIAGNOSIS — E663 Overweight: Secondary | ICD-10-CM | POA: Diagnosis not present

## 2021-08-10 DIAGNOSIS — Z0000 Encounter for general adult medical examination without abnormal findings: Secondary | ICD-10-CM

## 2021-08-10 DIAGNOSIS — F909 Attention-deficit hyperactivity disorder, unspecified type: Secondary | ICD-10-CM | POA: Diagnosis not present

## 2021-08-10 MED ORDER — METHYLPHENIDATE HCL ER (OSM) 27 MG PO TBCR: 27.0000 mg | EXTENDED_RELEASE_TABLET | ORAL | 0 refills | Status: DC

## 2021-08-10 NOTE — Assessment & Plan Note (Signed)
Discussed importance of healthy weight management Discussed diet and exercise  

## 2021-08-10 NOTE — Assessment & Plan Note (Signed)
Previously well controlled on concerta but difficulty obtaining due to insurance ? will renew today ?

## 2021-11-24 ENCOUNTER — Other Ambulatory Visit: Payer: Self-pay | Admitting: Family Medicine

## 2021-11-27 MED ORDER — EPINEPHRINE 0.3 MG/0.3ML IJ SOAJ: 0.3000 mg | INTRAMUSCULAR | 2 refills | Status: DC | PRN

## 2021-11-28 MED ORDER — METHYLPHENIDATE HCL ER (OSM) 27 MG PO TBCR: 27.0000 mg | EXTENDED_RELEASE_TABLET | ORAL | 0 refills | Status: DC

## 2021-11-29 ENCOUNTER — Other Ambulatory Visit: Payer: Self-pay

## 2021-11-29 MED ORDER — EPINEPHRINE 0.3 MG/0.3ML IJ SOAJ: 0.3000 mg | INTRAMUSCULAR | 2 refills | Status: DC | PRN

## 2022-02-12 ENCOUNTER — Ambulatory Visit: Payer: BC Managed Care – PPO | Admitting: Family Medicine

## 2022-03-07 NOTE — Progress Notes (Unsigned)
   I,Nicholas Villanueva S [name],acting as a scribe for Nicholas Paganini, MD.,have documented all relevant documentation on the behalf of Nicholas Paganini, MD,as directed by  Nicholas Paganini, MD while in the presence of Nicholas Paganini, MD.     Established patient visit   Patient: Nicholas Villanueva   DOB: March 22, 1996   26 y.o. Male  MRN: [id number] Visit Date: 03/08/2022  Today's healthcare provider: Lavon Paganini, MD   No chief complaint on file.  Subjective    HPI  Follow up for ADHD  The patient was last seen for this 6 months ago. Changes made at last visit include no changes.  He reports {excellent/good/fair/poor:19665} compliance with treatment. He feels that condition is {improved/worse/unchanged:3041574}. He {is/is not:21021397} having side effects. ***  -----------------------------------------------------------------------------------------   Medications: Outpatient Medications Prior to Visit  Medication Sig   albuterol (PROVENTIL HFA;VENTOLIN HFA) 108 (90 Base) MCG/ACT inhaler Inhale 2 puffs into the lungs every 6 (six) hours as needed for wheezing or shortness of breath.   EPINEPHrine 0.3 mg/0.3 mL IJ SOAJ injection Inject 0.3 mg into the muscle as needed for anaphylaxis.   methylphenidate (CONCERTA) 27 MG PO CR tablet Take 1 tablet (27 mg total) by mouth every morning.   methylphenidate (CONCERTA) 27 MG PO CR tablet Take 1 tablet (27 mg total) by mouth every morning.   methylphenidate (CONCERTA) 27 MG PO CR tablet Take 1 tablet (27 mg total) by mouth every morning.   methylphenidate (CONCERTA) 27 MG PO CR tablet Take 1 tablet (27 mg total) by mouth every morning.   No facility-administered medications prior to visit.    Review of Systems  {Labs  Heme  Chem  Endocrine  Serology  Results Review (optional):23779}   Objective    There were no vitals taken for this visit. {Show previous vital signs (optional):23777}  Physical Exam  ***  No results found for  any visits on 03/08/22.  Assessment & Plan     ***  No follow-ups on file.      {provider attestation***:1}   Nicholas Paganini, MD  [facility] [phone] (phone) [phone] (fax)  [place]

## 2022-03-08 ENCOUNTER — Encounter: Payer: Self-pay | Admitting: Family Medicine

## 2022-03-08 ENCOUNTER — Ambulatory Visit (INDEPENDENT_AMBULATORY_CARE_PROVIDER_SITE_OTHER): Payer: BC Managed Care – PPO | Admitting: Family Medicine

## 2022-03-08 VITALS — BP 123/85 | HR 86 | Temp 98.0°F | Resp 16 | Wt 188.0 lb

## 2022-03-08 DIAGNOSIS — F909 Attention-deficit hyperactivity disorder, unspecified type: Secondary | ICD-10-CM

## 2022-03-08 DIAGNOSIS — Z23 Encounter for immunization: Secondary | ICD-10-CM | POA: Diagnosis not present

## 2022-03-08 MED ORDER — METHYLPHENIDATE HCL ER (OSM) 27 MG PO TBCR: 27.0000 mg | EXTENDED_RELEASE_TABLET | ORAL | 0 refills | Status: DC

## 2022-03-08 NOTE — Assessment & Plan Note (Signed)
Chronic and well controlled Continue Concerta at current dose No side effects 4-month refill sent in today 

## 2022-07-25 DIAGNOSIS — D2261 Melanocytic nevi of right upper limb, including shoulder: Secondary | ICD-10-CM | POA: Diagnosis not present

## 2022-07-25 DIAGNOSIS — D2272 Melanocytic nevi of left lower limb, including hip: Secondary | ICD-10-CM | POA: Diagnosis not present

## 2022-07-25 DIAGNOSIS — D225 Melanocytic nevi of trunk: Secondary | ICD-10-CM | POA: Diagnosis not present

## 2022-07-25 DIAGNOSIS — D2262 Melanocytic nevi of left upper limb, including shoulder: Secondary | ICD-10-CM | POA: Diagnosis not present

## 2022-08-07 ENCOUNTER — Ambulatory Visit (INDEPENDENT_AMBULATORY_CARE_PROVIDER_SITE_OTHER): Payer: BC Managed Care – PPO | Admitting: Family Medicine

## 2022-08-07 ENCOUNTER — Encounter: Payer: Self-pay | Admitting: Family Medicine

## 2022-08-07 VITALS — BP 113/74 | HR 69 | Temp 98.2°F | Resp 14 | Ht 69.0 in | Wt 192.9 lb

## 2022-08-07 DIAGNOSIS — Z0000 Encounter for general adult medical examination without abnormal findings: Secondary | ICD-10-CM | POA: Diagnosis not present

## 2022-08-07 DIAGNOSIS — F909 Attention-deficit hyperactivity disorder, unspecified type: Secondary | ICD-10-CM

## 2022-08-07 MED ORDER — METHYLPHENIDATE HCL ER (OSM) 27 MG PO TBCR: 27.0000 mg | EXTENDED_RELEASE_TABLET | ORAL | 0 refills | Status: DC

## 2022-08-07 NOTE — Progress Notes (Signed)
I,Sulibeya S Dimas,acting as a Education administrator for Lavon Paganini, MD.,have documented all relevant documentation on the behalf of Lavon Paganini, MD,as directed by  Lavon Paganini, MD while in the presence of Lavon Paganini, MD.    Complete physical exam   Patient: Nicholas Villanueva   DOB: May 27, 1996   27 y.o. Male  MRN: [id number] Visit Date: 08/07/2022  Today's healthcare provider: Lavon Paganini, MD   Chief Complaint  Patient presents with   Annual Exam   Subjective    Nicholas Villanueva is a 27 y.o. male who presents today for a complete physical exam.  He reports consuming a general diet. The patient does not participate in regular exercise at present. He generally feels well. He reports sleeping well. He does not have additional problems to discuss today.  HPI    Past Medical History:  Diagnosis Date   ADHD (attention deficit hyperactivity disorder)    Asthma    Past Surgical History:  Procedure Laterality Date   PILONIDAL CYST EXCISION N/A 12/12/2015   Procedure: CYST EXCISION PILONIDAL EXTENSIVE;  Surgeon: Robert Bellow, MD;  Location: [facility] ORS;  Service: General;  Laterality: N/A;   skin biopsies  8 years ago   neck   WISDOM TOOTH EXTRACTION     Social History   Socioeconomic History   Marital status: Single    Spouse name: Not on file   Number of children: 0   Years of education: Not on file   Highest education level: Some college, no degree  Occupational History   Occupation: Psychologist, educational  Tobacco Use   Smoking status: Never   Smokeless tobacco: Never  Vaping Use   Vaping Use: Never used  Substance and Sexual Activity   Alcohol use: No    Alcohol/week: 0.0 standard drinks of alcohol   Drug use: No   Sexual activity: Never  Other Topics Concern   Not on file  Social History Narrative   Not on file   Social Determinants of Health   Financial Resource Strain: Low Risk  (08/05/2017)   Overall Financial Resource Strain (CARDIA)    Difficulty  of Paying Living Expenses: Not very hard  Food Insecurity: No Food Insecurity (08/05/2017)   Hunger Vital Sign    Worried About Running Out of Food in the Last Year: Never true    [place] in the Last Year: Never true  Transportation Needs: No Transportation Needs (08/05/2017)   PRAPARE - Hydrologist (Medical): No    Lack of Transportation (Non-Medical): No  Physical Activity: Inactive (08/05/2017)   Exercise Vital Sign    Days of Exercise per Week: 0 days    Minutes of Exercise per Session: 0 min  Stress: Not on file  Social Connections: Not on file  Intimate Partner Violence: Not on file   Family Status  Relation Name Status   Mother  Alive   Father  Alive   Brother  Alive   MGM  Alive   MGF  Alive   PGM  Alive   PGF  Alive   Neg Hx  (Not Specified)   Family History  Problem Relation Age of Onset   Breast cancer Mother        in remission   Arthritis Mother    Healthy Father    Healthy Brother    Breast cancer Maternal Grandmother    Rheum arthritis Maternal Grandmother    Diabetes Maternal Grandfather    Hypertension  Maternal Grandfather    Skin cancer Maternal Grandfather    Diabetes Paternal Grandmother    Hypertension Paternal Grandmother    Hypertension Paternal Grandfather    Skin cancer Paternal Grandfather    Colon cancer Neg Hx    Prostate cancer Neg Hx    Allergies  Allergen Reactions   Other Anaphylaxis    Peanuts    Patient Care Team: Jordyan Hardiman, Dionne Bucy, MD as PCP - General (Family Medicine) Dasher, Rayvon Char, MD (Dermatology) Robert Bellow, MD (General Surgery)   Medications: Outpatient Medications Prior to Visit  Medication Sig   albuterol (PROVENTIL HFA;VENTOLIN HFA) 108 (90 Base) MCG/ACT inhaler Inhale 2 puffs into the lungs every 6 (six) hours as needed for wheezing or shortness of breath.   EPINEPHrine 0.3 mg/0.3 mL IJ SOAJ injection Inject 0.3 mg into the muscle as needed for anaphylaxis.    [DISCONTINUED] methylphenidate (CONCERTA) 27 MG PO CR tablet Take 1 tablet (27 mg total) by mouth every morning.   [DISCONTINUED] methylphenidate (CONCERTA) 27 MG PO CR tablet Take 1 tablet (27 mg total) by mouth every morning.   [DISCONTINUED] methylphenidate (CONCERTA) 27 MG PO CR tablet Take 1 tablet (27 mg total) by mouth every morning.   [DISCONTINUED] methylphenidate (CONCERTA) 27 MG PO CR tablet Take 1 tablet (27 mg total) by mouth every morning.   No facility-administered medications prior to visit.    Review of Systems  Allergic/Immunologic: Positive for food allergies.  Psychiatric/Behavioral:  The patient is nervous/anxious and is hyperactive.   All other systems reviewed and are negative.     Objective    BP 113/74 (BP Location: Left Arm, Patient Position: Sitting, Cuff Size: Normal)   Pulse 69   Temp 98.2 F (36.8 C) (Temporal)   Resp 14   Ht '5\' 9"'$  (1.753 m)   Wt 192 lb 14.4 oz (87.5 kg)   SpO2 100%   BMI 28.49 kg/m     Physical Exam Vitals reviewed.  Constitutional:      General: He is not in acute distress.    Appearance: Normal appearance. He is well-developed. He is not diaphoretic.  HENT:     Head: Normocephalic and atraumatic.     Right Ear: Tympanic membrane, ear canal and external ear normal.     Left Ear: Tympanic membrane, ear canal and external ear normal.     Nose: Nose normal.     Mouth/Throat:     Mouth: Mucous membranes are moist.     Pharynx: Oropharynx is clear. No oropharyngeal exudate.  Eyes:     General: No scleral icterus.    Conjunctiva/sclera: Conjunctivae normal.     Pupils: Pupils are equal, round, and reactive to light.  Neck:     Thyroid: No thyromegaly.  Cardiovascular:     Rate and Rhythm: Normal rate and regular rhythm.     Pulses: Normal pulses.     Heart sounds: Normal heart sounds. No murmur heard. Pulmonary:     Effort: Pulmonary effort is normal. No respiratory distress.     Breath sounds: Normal breath sounds. No  wheezing or rales.  Abdominal:     General: There is no distension.     Palpations: Abdomen is soft.     Tenderness: There is no abdominal tenderness.  Musculoskeletal:        General: No deformity.     Cervical back: Neck supple.     Right lower leg: No edema.     Left lower leg: No edema.  Lymphadenopathy:     Cervical: No cervical adenopathy.  Skin:    General: Skin is warm and dry.     Findings: No rash.  Neurological:     Mental Status: He is alert and oriented to person, place, and time. Mental status is at baseline.     Gait: Gait normal.  Psychiatric:        Mood and Affect: Mood normal.        Behavior: Behavior normal.        Thought Content: Thought content normal.       Last depression screening scores    08/07/2022   10:37 AM 03/08/2022   11:07 AM 08/10/2021    9:03 AM  PHQ 2/9 Scores  PHQ - 2 Score 1 0 1  PHQ- 9 Score '5 4 7   '$ Last fall risk screening    08/07/2022   10:36 AM  [place] in the past year? 0  Number falls in past yr: 0  Injury with Fall? 0  Risk for fall due to : No Fall Risks  Follow up Falls evaluation completed   Last Audit-C alcohol use screening    08/07/2022   10:37 AM  Alcohol Use Disorder Test (AUDIT)  1. How often do you have a drink containing alcohol? 1  2. How many drinks containing alcohol do you have on a typical day when you are drinking? 0  3. How often do you have six or more drinks on one occasion? 0  AUDIT-C Score 1   A score of 3 or more in women, and 4 or more in men indicates increased risk for alcohol abuse, EXCEPT if all of the points are from question 1   No results found for any visits on 08/07/22.  Assessment & Plan    Routine Health Maintenance and Physical Exam  Exercise Activities and Dietary recommendations  Goals   None     Immunization History  Administered Date(s) Administered   DTaP 05/03/1995, 07/05/1995, 09/06/1995, 08/14/1996, 01/13/2001   HIB (PRP-OMP) 05/03/1995, 07/05/1995,  09/06/1995, 08/14/1996   HPV Quadrivalent 01/23/2011, 03/23/2011, 01/25/2012   Hepatitis A 01/30/2013   Hepatitis A, Adult 08/05/2017   Hepatitis B 21-Mar-1995, 05/03/1995, 12/05/1995   IPV 05/03/1995, 07/05/1995, 09/06/1995, 01/13/2001   Influenza,inj,Quad PF,6+ Mos 08/07/2018, 02/15/2020, 02/07/2021, 03/08/2022   MMR 03/06/1996, 01/13/2001   Meningococcal Mcv4o 01/20/2013   PFIZER(Purple Top)SARS-COV-2 Vaccination 08/12/2019, 05/25/2020, 08/01/2020   Td 08/05/2017   Tdap 11/22/2006   Varicella 03/06/1996, 01/23/2011    Health Maintenance  Topic Date Due   Hepatitis C Screening  Never done   COVID-19 Vaccine (4 - 2023-24 season) 02/02/2022   DTaP/Tdap/Td (8 - Td or Tdap) 08/06/2027   INFLUENZA VACCINE  Completed   HPV VACCINES  Completed   HIV Screening  Completed    Discussed health benefits of physical activity, and encouraged him to engage in regular exercise appropriate for his age and condition.  Problem List Items Addressed This Visit       Other   ADHD    Chronic and well controlled Continue Concerta at current dose No side effects 15-monthrefill sent in today      Other Visit Diagnoses     Encounter for annual physical exam    -  Primary        Return in about 6 months (around 02/07/2023) for ADD f/u, virtual ok.     I, ALavon Paganini MD, have reviewed all documentation for this visit.  The documentation on 08/07/22 for the exam, diagnosis, procedures, and orders are all accurate and complete.   Leander Tout, Dionne Bucy, MD, MPH [facility]

## 2022-08-07 NOTE — Assessment & Plan Note (Signed)
Chronic and well controlled Continue Concerta at current dose No side effects 66-monthrefill sent in today

## 2023-02-07 ENCOUNTER — Ambulatory Visit: Payer: BC Managed Care – PPO | Admitting: Family Medicine

## 2023-02-12 ENCOUNTER — Telehealth (INDEPENDENT_AMBULATORY_CARE_PROVIDER_SITE_OTHER): Payer: BC Managed Care – PPO | Admitting: Family Medicine

## 2023-02-12 DIAGNOSIS — F909 Attention-deficit hyperactivity disorder, unspecified type: Secondary | ICD-10-CM | POA: Diagnosis not present

## 2023-02-12 MED ORDER — METHYLPHENIDATE HCL ER (OSM) 27 MG PO TBCR: 27.0000 mg | EXTENDED_RELEASE_TABLET | ORAL | 0 refills | Status: DC

## 2023-02-12 NOTE — Progress Notes (Signed)
MyChart Video Visit    Virtual Visit via Video Note   This format is felt to be most appropriate for this patient at this time. Physical exam was limited by quality of the video and audio technology used for the visit.    Patient location: home Provider location: [facility] Persons involved in the visit: patient, provider   I discussed the limitations of evaluation and management by telemedicine and the availability of in person appointments. The patient expressed understanding and agreed to proceed.  Patient: Nicholas Villanueva   DOB: [birth year]   27 y.o. Male  MRN: [id number] Visit Date: 02/12/2023  Today's healthcare provider: Shirlee Latch, MD   No chief complaint on file.  Subjective    HPI  Discussed the use of AI scribe software for clinical note transcription with the patient, who gave verbal consent to proceed.  History of Present Illness   The patient, with a history of ADHD, reports no concerns or problems with their current medication regimen. They are currently taking Concerta 27mg  daily and report good focus and no side effects. The patient's ADHD symptoms are well controlled on this regimen.        Medications: Outpatient Medications Prior to Visit  Medication Sig   albuterol (PROVENTIL HFA;VENTOLIN HFA) 108 (90 Base) MCG/ACT inhaler Inhale 2 puffs into the lungs every 6 (six) hours as needed for wheezing or shortness of breath.   EPINEPHrine 0.3 mg/0.3 mL IJ SOAJ injection Inject 0.3 mg into the muscle as needed for anaphylaxis.   [DISCONTINUED] methylphenidate (CONCERTA) 27 MG PO CR tablet Take 1 tablet (27 mg total) by mouth every morning.   [DISCONTINUED] methylphenidate (CONCERTA) 27 MG PO CR tablet Take 1 tablet (27 mg total) by mouth every morning.   [DISCONTINUED] methylphenidate (CONCERTA) 27 MG PO CR tablet Take 1 tablet (27 mg total) by mouth every morning.   [DISCONTINUED] methylphenidate (CONCERTA) 27 MG PO CR tablet Take 1  tablet (27 mg total) by mouth every morning.   No facility-administered medications prior to visit.    Review of Systems      Objective    There were no vitals taken for this visit.      Physical Exam Constitutional:      General: He is not in acute distress.    Appearance: Normal appearance. He is not diaphoretic.  HENT:     Head: Normocephalic.  Eyes:     Conjunctiva/sclera: Conjunctivae normal.  Pulmonary:     Effort: Pulmonary effort is normal. No respiratory distress.  Neurological:     Mental Status: He is alert and oriented to person, place, and time. Mental status is at baseline.        Assessment & Plan     Problem List Items Addressed This Visit       Other   ADHD - Primary    Stable on Concerta 27mg  daily with good focus and no reported side effects. -[facility] [place]. -Next follow-up in March for annual physical.          Return in about 6 months (around 08/12/2023) for CPE.     I discussed the assessment and treatment plan with the patient. The patient was provided an opportunity to ask questions and all were answered. The patient agreed with the plan and demonstrated an understanding of the instructions.   The patient was advised to call back or seek an in-person evaluation if the symptoms worsen  or if the condition fails to improve as anticipated.   Shirlee Latch, MD [facility] [phone] (phone) [phone] (fax)  [facility]

## 2023-02-12 NOTE — Assessment & Plan Note (Signed)
Stable on Concerta 27mg  daily with good focus and no reported side effects. -Send ref[facility] [place]. -Next follow-up in March for annual physical.

## 2023-07-31 DIAGNOSIS — D225 Melanocytic nevi of trunk: Secondary | ICD-10-CM | POA: Diagnosis not present

## 2023-07-31 DIAGNOSIS — D2272 Melanocytic nevi of left lower limb, including hip: Secondary | ICD-10-CM | POA: Diagnosis not present

## 2023-07-31 DIAGNOSIS — D2262 Melanocytic nevi of left upper limb, including shoulder: Secondary | ICD-10-CM | POA: Diagnosis not present

## 2023-07-31 DIAGNOSIS — D2261 Melanocytic nevi of right upper limb, including shoulder: Secondary | ICD-10-CM | POA: Diagnosis not present

## 2023-08-13 ENCOUNTER — Encounter: Payer: Self-pay | Admitting: Family Medicine

## 2023-08-13 ENCOUNTER — Ambulatory Visit (INDEPENDENT_AMBULATORY_CARE_PROVIDER_SITE_OTHER): Admitting: Family Medicine

## 2023-08-13 VITALS — BP 120/84 | HR 80 | Ht 69.0 in | Wt 192.4 lb

## 2023-08-13 DIAGNOSIS — Z1159 Encounter for screening for other viral diseases: Secondary | ICD-10-CM

## 2023-08-13 DIAGNOSIS — Z23 Encounter for immunization: Secondary | ICD-10-CM

## 2023-08-13 DIAGNOSIS — F909 Attention-deficit hyperactivity disorder, unspecified type: Secondary | ICD-10-CM | POA: Diagnosis not present

## 2023-08-13 DIAGNOSIS — Z0000 Encounter for general adult medical examination without abnormal findings: Secondary | ICD-10-CM | POA: Diagnosis not present

## 2023-08-13 MED ORDER — METHYLPHENIDATE HCL ER (OSM) 27 MG PO TBCR: 27.0000 mg | EXTENDED_RELEASE_TABLET | ORAL | 0 refills | Status: DC

## 2023-08-13 MED ORDER — EPINEPHRINE 0.3 MG/0.3ML IJ SOAJ: 0.3000 mg | INTRAMUSCULAR | 2 refills | Status: AC | PRN

## 2023-08-13 NOTE — Progress Notes (Signed)
 Complete physical exam   Patient: Nicholas Villanueva   DOB: Sep 26, 1995   28 y.o. Male  MRN: [id number] Visit Date: 08/13/2023  Today's healthcare provider: Shirlee Latch, MD   Chief Complaint  Patient presents with   Annual Exam    Last completed 08/07/22 Diet -  general, well balanced Exercise - none Feeling - well Sleeping -  fairly well with some days being harder than others Concerns - medication refills    Medication Refill    Pt would like a refill on concerta and epinephrine   Care Management    Hepatits C Screening - yes Pneumococcal Vaccine - yes   Subjective    Nicholas Villanueva is a 28 y.o. male who presents today for a complete physical exam.    Discussed the use of AI scribe software for clinical note transcription with the patient, who gave verbal consent to proceed.  History of Present Illness   The patient, with a history of ADHD managed on Concerta 27mg , presents for a routine physical and to receive a pneumonia shot. He reports no new complaints and states that the Concerta is working well. He expresses interest in having recent blood work done for [employer] purposes, mentioning a previous finding of low globulin. The patient also mentions a term "para MD" that he was asked about by his insurance agent, but neither the patient nor the doctor are familiar with this term.        Last depression screening scores    08/13/2023   10:43 AM 08/07/2022   10:37 AM 03/08/2022   11:07 AM  PHQ 2/9 Scores  PHQ - 2 Score 1 1 0  PHQ- 9 Score 4 5 4    Last fall risk screening    08/13/2023   10:44 AM  Fall Risk   Falls in the past year? 0  Number falls in past yr: 0  Injury with Fall? 0  Risk for fall due to : No Fall Risks  Follow up Falls evaluation completed        Medications: Outpatient Medications Prior to Visit  Medication Sig   albuterol (PROVENTIL HFA;VENTOLIN HFA) 108 (90 Base) MCG/ACT inhaler Inhale 2 puffs into the lungs every 6 (six)  hours as needed for wheezing or shortness of breath.   [DISCONTINUED] EPINEPHrine 0.3 mg/0.3 mL IJ SOAJ injection Inject 0.3 mg into the muscle as needed for anaphylaxis.   [DISCONTINUED] methylphenidate (CONCERTA) 27 MG PO CR tablet Take 1 tablet (27 mg total) by mouth every morning.   [DISCONTINUED] methylphenidate (CONCERTA) 27 MG PO CR tablet Take 1 tablet (27 mg total) by mouth every morning.   [DISCONTINUED] methylphenidate (CONCERTA) 27 MG PO CR tablet Take 1 tablet (27 mg total) by mouth every morning.   [DISCONTINUED] methylphenidate (CONCERTA) 27 MG PO CR tablet Take 1 tablet (27 mg total) by mouth every morning.   No facility-administered medications prior to visit.    Review of Systems    Objective    BP 120/84 (BP Location: Left Arm, Patient Position: Sitting, Cuff Size: Normal)   Pulse 80   Ht 5\' 9"  (1.753 m)   Wt 192 lb 6.4 oz (87.3 kg)   SpO2 100%   BMI 28.41 kg/m    Physical Exam Vitals reviewed.  Constitutional:      General: He is not in acute distress.    Appearance: Normal appearance. He is well-developed. He is not diaphoretic.  HENT:     Head: Normocephalic  and atraumatic.     Right Ear: Tympanic membrane, ear canal and external ear normal.     Left Ear: Tympanic membrane, ear canal and external ear normal.     Nose: Nose normal.     Mouth/Throat:     Mouth: Mucous membranes are moist.     Pharynx: Oropharynx is clear. No oropharyngeal exudate.  Eyes:     General: No scleral icterus.    Conjunctiva/sclera: Conjunctivae normal.     Pupils: Pupils are equal, round, and reactive to light.  Neck:     Thyroid: No thyromegaly.  Cardiovascular:     Rate and Rhythm: Normal rate and regular rhythm.     Heart sounds: Normal heart sounds. No murmur heard. Pulmonary:     Effort: Pulmonary effort is normal. No respiratory distress.     Breath sounds: Normal breath sounds. No wheezing or rales.  Abdominal:     General: There is no distension.      Palpations: Abdomen is soft.     Tenderness: There is no abdominal tenderness.  Musculoskeletal:        General: No deformity.     Cervical back: Neck supple.     Right lower leg: No edema.     Left lower leg: No edema.  Lymphadenopathy:     Cervical: No cervical adenopathy.  Skin:    General: Skin is warm and dry.     Findings: No rash.  Neurological:     Mental Status: He is alert and oriented to person, place, and time. Mental status is at baseline.     Gait: Gait normal.  Psychiatric:        Mood and Affect: Mood normal.        Behavior: Behavior normal.        Thought Content: Thought content normal.      No results found for any visits on 08/13/23.  Assessment & Plan    Routine Health Maintenance and Physical Exam  Exercise Activities and Dietary recommendations  Goals   None     Immunization History  Administered Date(s) Administered   DTaP 05/03/1995, 07/05/1995, 09/06/1995, 08/14/1996, 01/13/2001   HIB (PRP-OMP) 05/03/1995, 07/05/1995, 09/06/1995, 08/14/1996   HPV Quadrivalent 01/23/2011, 03/23/2011, 01/25/2012   Hepatitis A 01/30/2013   Hepatitis A, Adult 08/05/2017   Hepatitis B 11/10/1994, 05/03/1995, 12/05/1995   IPV 05/03/1995, 07/05/1995, 09/06/1995, 01/13/2001   Influenza,inj,Quad PF,6+ Mos 08/07/2018, 02/15/2020, 02/07/2021, 03/08/2022   MMR 03/06/1996, 01/13/2001   Meningococcal Mcv4o 01/20/2013   PFIZER(Purple Top)SARS-COV-2 Vaccination 08/12/2019, 05/25/2020, 08/01/2020   PNEUMOCOCCAL CONJUGATE-20 08/13/2023   Td 08/05/2017   Tdap 11/22/2006   Varicella 03/06/1996, 01/23/2011    Health Maintenance  Topic Date Due   Hepatitis C Screening  Never done   COVID-19 Vaccine (4 - 2024-25 season) 02/03/2023   INFLUENZA VACCINE  09/02/2023 (Originally 01/03/2023)   DTaP/Tdap/Td (8 - Td or Tdap) 08/06/2027   Pneumococcal Vaccine 47-46 Years old  Completed   HPV VACCINES  Completed   HIV Screening  Completed    Discussed health benefits of physical  activity, and encouraged him to engage in regular exercise appropriate for his age and condition.  Problem List Items Addressed This Visit       Other   ADHD   Currently on Concerta 27 mg, which is effective and appropriately dosed. - Continue Concerta 27 mg - Provide refills for Concerta      Other Visit Diagnoses       Encounter for annual physical  exam    -  Primary   Relevant Orders   Comprehensive metabolic panel   Lipid Panel With LDL/HDL Ratio   CBC w/Diff/Platelet     Need for pneumococcal vaccine       Relevant Orders   Pneumococcal conjugate vaccine 20-valent (Completed)     Need for hepatitis C screening test       Relevant Orders   Hepatitis C antibody          General Health Maintenance Received pneumonia vaccine today; plans to get flu vaccine. Discussed colon cancer screening starting at age 67 and a one-time hepatitis C screening for all adults.  - Administer flu vaccine - Order blood work including CBC, renal and hepatic panels, electrolytes, glucose, lipid profile, and hepatitis C screening - Schedule colon cancer screening at age 52 - Send lab results via MyChart        Return in about 6 months (around 02/13/2024) for ADD f/u, virtual ok.     Shirlee Latch, MD  [facility] [phone] (phone) [phone] (fax)  [facility]

## 2023-08-13 NOTE — Assessment & Plan Note (Signed)
 Currently on Concerta 27 mg, which is effective and appropriately dosed. - Continue Concerta 27 mg - Provide refills for Concerta

## 2023-08-14 LAB — CBC WITH DIFFERENTIAL/PLATELET
Basophils Absolute: 0 10*3/uL (ref 0.0–0.2)
Basos: 1 %
EOS (ABSOLUTE): 0 10*3/uL (ref 0.0–0.4)
Eos: 1 %
Hematocrit: 48.1 % (ref 37.5–51.0)
Hemoglobin: 15.8 g/dL (ref 13.0–17.7)
Immature Grans (Abs): 0 10*3/uL (ref 0.0–0.1)
Immature Granulocytes: 0 %
Lymphocytes Absolute: 1.5 10*3/uL (ref 0.7–3.1)
Lymphs: 23 %
MCH: 30 pg (ref 26.6–33.0)
MCHC: 32.8 g/dL (ref 31.5–35.7)
MCV: 91 fL (ref 79–97)
Monocytes Absolute: 0.7 10*3/uL (ref 0.1–0.9)
Monocytes: 10 %
Neutrophils Absolute: 4.4 10*3/uL (ref 1.4–7.0)
Neutrophils: 65 %
Platelets: 314 10*3/uL (ref 150–450)
RBC: 5.26 x10E6/uL (ref 4.14–5.80)
RDW: 12.5 % (ref 11.6–15.4)
WBC: 6.6 10*3/uL (ref 3.4–10.8)

## 2023-08-14 LAB — COMPREHENSIVE METABOLIC PANEL
ALT: 15 IU/L (ref 0–44)
AST: 21 IU/L (ref 0–40)
Albumin: 4.7 g/dL (ref 4.3–5.2)
Alkaline Phosphatase: 63 IU/L (ref 44–121)
BUN/Creatinine Ratio: 8 — ABNORMAL LOW (ref 9–20)
BUN: 8 mg/dL (ref 6–20)
Bilirubin Total: 0.7 mg/dL (ref 0.0–1.2)
CO2: 24 mmol/L (ref 20–29)
Calcium: 9.6 mg/dL (ref 8.7–10.2)
Chloride: 102 mmol/L (ref 96–106)
Creatinine, Ser: 0.99 mg/dL (ref 0.76–1.27)
Globulin, Total: 1.7 g/dL (ref 1.5–4.5)
Glucose: 86 mg/dL (ref 70–99)
Potassium: 4.4 mmol/L (ref 3.5–5.2)
Sodium: 141 mmol/L (ref 134–144)
Total Protein: 6.4 g/dL (ref 6.0–8.5)
eGFR: 106 mL/min/{1.73_m2} (ref >59)

## 2023-08-14 LAB — HEPATITIS C ANTIBODY: Hep C Virus Ab: NONREACTIVE

## 2023-08-14 LAB — LIPID PANEL WITH LDL/HDL RATIO
Cholesterol, Total: 184 mg/dL (ref 100–199)
HDL: 71 mg/dL (ref >39)
LDL Chol Calc (NIH): 100 mg/dL — ABNORMAL HIGH (ref 0–99)
LDL/HDL Ratio: 1.4 ratio (ref 0.0–3.6)
Triglycerides: 70 mg/dL (ref 0–149)
VLDL Cholesterol Cal: 13 mg/dL (ref 5–40)

## 2023-08-15 ENCOUNTER — Encounter: Payer: Self-pay | Admitting: Family Medicine

## 2024-02-13 ENCOUNTER — Telehealth: Admitting: Family Medicine

## 2024-02-13 ENCOUNTER — Encounter: Payer: Self-pay | Admitting: Family Medicine

## 2024-02-13 DIAGNOSIS — F909 Attention-deficit hyperactivity disorder, unspecified type: Secondary | ICD-10-CM

## 2024-02-13 MED ORDER — METHYLPHENIDATE HCL ER (OSM) 27 MG PO TBCR: 27.0000 mg | EXTENDED_RELEASE_TABLET | ORAL | 0 refills | Status: DC

## 2024-02-13 NOTE — Progress Notes (Signed)
 MyChart Video Visit    Virtual Visit via Video Note   This format is felt to be most appropriate for this patient at this time. Physical exam was limited by quality of the video and audio technology used for the visit.    Patient location: home Provider location: [facility] Persons involved in the visit: patient, provider  I discussed the limitations of evaluation and management by telemedicine and the availability of in person appointments. The patient expressed understanding and agreed to proceed.  Patient: Nicholas Villanueva   DOB: Aug 11, 1995   27 y.o. Male  MRN: [id number] Visit Date: 02/13/2024  Today's healthcare provider: Jon Eva, MD   No chief complaint on file.  Subjective    HPI   Discussed the use of AI scribe software for clinical note transcription with the patient, who gave verbal consent to proceed.  History of Present Illness   Nicholas Villanueva is a 28 year old male who presents for a follow-up on ADHD management with Concerta  27 mg daily.  He ran out of Concerta  last month and attempted to manage without it, realizing its importance in controlling his symptoms. He experiences some loss of appetite as a side effect of Concerta , which he manages by eating before taking the medication and having snacks throughout the day. He ensures to eat dinner to compensate for reduced appetite. No other side effects such as trouble sleeping or chest pain are present.         Review of Systems      Objective    There were no vitals taken for this visit.      Physical Exam Constitutional:      General: He is not in acute distress.    Appearance: Normal appearance. He is not diaphoretic.  HENT:     Head: Normocephalic.  Eyes:     Conjunctiva/sclera: Conjunctivae normal.  Pulmonary:     Effort: Pulmonary effort is normal. No respiratory distress.  Neurological:     Mental Status: He is alert and oriented to person, place, and time.  Mental status is at baseline.        Assessment & Plan     Problem List Items Addressed This Visit       Other   ADHD - Primary   ADHD is well-managed on Concerta  27 mg daily. He experienced a period off medication, reinforcing the necessity of treatment. Reports some loss of appetite, but it is not severe and he has developed strategies to manage it. No other side effects such as insomnia or chest pain were reported. - Continue Concerta  27 mg daily. - [facility] [address] - Advise to contact for additional refills if needed before the next visit.         Meds ordered this encounter  Medications   DISCONTD: methylphenidate  (CONCERTA ) 27 MG PO CR tablet    Sig: Take 1 tablet (27 mg total) by mouth every morning.    Dispense:  30 tablet    Refill:  0    Do not fill <30 days from last refill   methylphenidate  (CONCERTA ) 27 MG PO CR tablet    Sig: Take 1 tablet (27 mg total) by mouth every morning.    Dispense:  30 tablet    Refill:  0    Do not fill <30 days from last refill   DISCONTD: methylphenidate  (CONCERTA ) 27 MG PO CR tablet    Sig: Take 1 tablet (  27 mg total) by mouth every morning.    Dispense:  30 tablet    Refill:  0    Do not fill <30 days from last refill   methylphenidate  (CONCERTA ) 27 MG PO CR tablet    Sig: Take 1 tablet (27 mg total) by mouth every morning.    Dispense:  30 tablet    Refill:  0    Do not fill <30 days from last refill   methylphenidate  (CONCERTA ) 27 MG PO CR tablet    Sig: Take 1 tablet (27 mg total) by mouth every morning.    Dispense:  30 tablet    Refill:  0    Do not fill <30 days from last refill   methylphenidate  (CONCERTA ) 27 MG PO CR tablet    Sig: Take 1 tablet (27 mg total) by mouth every morning.    Dispense:  30 tablet    Refill:  0    Do not fill <30 days from last refill     Return in about 6 months (around 08/12/2024) for CPE.     I discussed the assessment and treatment plan with the patient.  The patient was provided an opportunity to ask questions and all were answered. The patient agreed with the plan and demonstrated an understanding of the instructions.   The patient was advised to call back or seek an in-person evaluation if the symptoms worsen or if the condition fails to improve as anticipated.  Jon Eva, MD [facility] [phone] (phone) [phone] (fax)  [facility]

## 2024-02-13 NOTE — Assessment & Plan Note (Signed)
 ADHD is well-managed on Concerta  27 mg daily. He experienced a period off medication, reinforcing the necessity of treatment. Reports some loss of appetite, but it is not severe and he has developed strategies to manage it. No other side effects such as insomnia or chest pain were reported. - Continue Concerta  27 mg daily. - [facility] [address] - Advise to contact for additional refills if needed before the next visit.

## 2024-06-24 ENCOUNTER — Encounter: Payer: Self-pay | Admitting: Family Medicine

## 2024-06-25 MED ORDER — METHYLPHENIDATE HCL ER (OSM) 27 MG PO TBCR: 27.0000 mg | EXTENDED_RELEASE_TABLET | ORAL | 0 refills | Status: AC
